# Patient Record
Sex: Female | Born: 1935
Health system: Southern US, Community
[De-identification: ages and names within clinical notes are randomized; demographics above are authoritative.]

## PROBLEM LIST (undated history)

## (undated) DIAGNOSIS — N289 Disorder of kidney and ureter, unspecified: Secondary | ICD-10-CM

## (undated) DIAGNOSIS — E119 Type 2 diabetes mellitus without complications: Secondary | ICD-10-CM

## (undated) DIAGNOSIS — D649 Anemia, unspecified: Secondary | ICD-10-CM

## (undated) DIAGNOSIS — H269 Unspecified cataract: Secondary | ICD-10-CM

## (undated) DIAGNOSIS — I10 Essential (primary) hypertension: Secondary | ICD-10-CM

## (undated) DIAGNOSIS — J45909 Unspecified asthma, uncomplicated: Secondary | ICD-10-CM

## (undated) DIAGNOSIS — E785 Hyperlipidemia, unspecified: Secondary | ICD-10-CM

## (undated) HISTORY — DX: Unspecified asthma, uncomplicated: J45.909

## (undated) HISTORY — DX: Anemia, unspecified: D64.9

## (undated) HISTORY — PX: EYE SURGERY: SHX253

## (undated) HISTORY — DX: Type 2 diabetes mellitus without complications: E11.9

## (undated) HISTORY — DX: Essential (primary) hypertension: I10

## (undated) HISTORY — DX: Unspecified cataract: H26.9

## (undated) HISTORY — DX: Hyperlipidemia, unspecified: E78.5

## (undated) HISTORY — PX: NECK SURGERY: SHX720

## (undated) HISTORY — PX: SHOULDER SURGERY: SHX246

---

## 2000-07-29 ENCOUNTER — Encounter (INDEPENDENT_AMBULATORY_CARE_PROVIDER_SITE_OTHER): Payer: Self-pay | Admitting: Specialist

## 2000-07-29 ENCOUNTER — Ambulatory Visit (HOSPITAL_BASED_OUTPATIENT_CLINIC_OR_DEPARTMENT_OTHER): Admission: RE | Admit: 2000-07-29 | Discharge: 2000-07-29 | Payer: Self-pay | Admitting: Orthopedic Surgery

## 2004-09-11 ENCOUNTER — Ambulatory Visit: Payer: Self-pay | Admitting: Internal Medicine

## 2008-08-04 ENCOUNTER — Ambulatory Visit: Payer: Self-pay | Admitting: Interventional Radiology

## 2008-08-04 ENCOUNTER — Emergency Department (HOSPITAL_BASED_OUTPATIENT_CLINIC_OR_DEPARTMENT_OTHER): Admission: EM | Admit: 2008-08-04 | Discharge: 2008-08-04 | Payer: Self-pay | Admitting: Emergency Medicine

## 2010-11-18 LAB — CBC
MCV: 83.5 fL (ref 78.0–100.0)
RBC: 3.99 MIL/uL (ref 3.87–5.11)
WBC: 5.2 10*3/uL (ref 4.0–10.5)

## 2010-11-18 LAB — DIFFERENTIAL
Eosinophils Absolute: 0.1 10*3/uL (ref 0.0–0.7)
Lymphs Abs: 2.3 10*3/uL (ref 0.7–4.0)
Monocytes Relative: 10 % (ref 3–12)
Neutrophils Relative %: 44 % (ref 43–77)

## 2010-11-18 LAB — POCT CARDIAC MARKERS
CKMB, poc: 1.7 ng/mL (ref 1.0–8.0)
Myoglobin, poc: 95.3 ng/mL (ref 12–200)
Troponin i, poc: <0.05 ng/mL (ref 0.00–0.09)

## 2010-11-18 LAB — BASIC METABOLIC PANEL
Chloride: 105 mEq/L (ref 96–112)
Creatinine, Ser: 1.4 mg/dL — ABNORMAL HIGH (ref 0.4–1.2)
GFR calc Af Amer: 45 mL/min — ABNORMAL LOW (ref >60)
Potassium: 4.8 mEq/L (ref 3.5–5.1)

## 2016-05-15 DIAGNOSIS — E1122 Type 2 diabetes mellitus with diabetic chronic kidney disease: Secondary | ICD-10-CM | POA: Diagnosis not present

## 2016-05-15 DIAGNOSIS — N184 Chronic kidney disease, stage 4 (severe): Secondary | ICD-10-CM | POA: Diagnosis not present

## 2016-05-15 DIAGNOSIS — I129 Hypertensive chronic kidney disease with stage 1 through stage 4 chronic kidney disease, or unspecified chronic kidney disease: Secondary | ICD-10-CM | POA: Diagnosis not present

## 2016-05-15 DIAGNOSIS — M5412 Radiculopathy, cervical region: Secondary | ICD-10-CM | POA: Diagnosis not present

## 2016-05-15 DIAGNOSIS — Z23 Encounter for immunization: Secondary | ICD-10-CM | POA: Diagnosis not present

## 2016-05-26 DIAGNOSIS — M47812 Spondylosis without myelopathy or radiculopathy, cervical region: Secondary | ICD-10-CM | POA: Diagnosis not present

## 2016-05-26 DIAGNOSIS — M5032 Other cervical disc degeneration, mid-cervical region, unspecified level: Secondary | ICD-10-CM | POA: Diagnosis not present

## 2016-05-26 DIAGNOSIS — M4802 Spinal stenosis, cervical region: Secondary | ICD-10-CM | POA: Diagnosis not present

## 2016-05-30 DIAGNOSIS — E119 Type 2 diabetes mellitus without complications: Secondary | ICD-10-CM | POA: Diagnosis not present

## 2016-05-30 DIAGNOSIS — H524 Presbyopia: Secondary | ICD-10-CM | POA: Diagnosis not present

## 2016-05-30 DIAGNOSIS — H43813 Vitreous degeneration, bilateral: Secondary | ICD-10-CM | POA: Diagnosis not present

## 2016-05-30 DIAGNOSIS — H2511 Age-related nuclear cataract, right eye: Secondary | ICD-10-CM | POA: Diagnosis not present

## 2016-05-30 DIAGNOSIS — H25011 Cortical age-related cataract, right eye: Secondary | ICD-10-CM | POA: Diagnosis not present

## 2016-05-30 DIAGNOSIS — Z961 Presence of intraocular lens: Secondary | ICD-10-CM | POA: Diagnosis not present

## 2016-07-09 DIAGNOSIS — M5412 Radiculopathy, cervical region: Secondary | ICD-10-CM | POA: Diagnosis not present

## 2016-07-09 DIAGNOSIS — M4802 Spinal stenosis, cervical region: Secondary | ICD-10-CM | POA: Diagnosis not present

## 2016-07-09 DIAGNOSIS — G894 Chronic pain syndrome: Secondary | ICD-10-CM | POA: Diagnosis not present

## 2016-07-09 DIAGNOSIS — I6529 Occlusion and stenosis of unspecified carotid artery: Secondary | ICD-10-CM | POA: Diagnosis not present

## 2016-07-25 DIAGNOSIS — J302 Other seasonal allergic rhinitis: Secondary | ICD-10-CM | POA: Diagnosis not present

## 2016-07-29 DIAGNOSIS — R0982 Postnasal drip: Secondary | ICD-10-CM | POA: Diagnosis not present

## 2016-07-29 DIAGNOSIS — J3489 Other specified disorders of nose and nasal sinuses: Secondary | ICD-10-CM | POA: Diagnosis not present

## 2016-07-29 DIAGNOSIS — J209 Acute bronchitis, unspecified: Secondary | ICD-10-CM | POA: Diagnosis not present

## 2016-07-31 DIAGNOSIS — Z780 Asymptomatic menopausal state: Secondary | ICD-10-CM | POA: Diagnosis not present

## 2016-07-31 DIAGNOSIS — E7800 Pure hypercholesterolemia, unspecified: Secondary | ICD-10-CM | POA: Diagnosis not present

## 2016-07-31 DIAGNOSIS — Z888 Allergy status to other drugs, medicaments and biological substances status: Secondary | ICD-10-CM | POA: Diagnosis not present

## 2016-07-31 DIAGNOSIS — B9789 Other viral agents as the cause of diseases classified elsewhere: Secondary | ICD-10-CM | POA: Diagnosis not present

## 2016-07-31 DIAGNOSIS — J988 Other specified respiratory disorders: Secondary | ICD-10-CM | POA: Diagnosis not present

## 2016-07-31 DIAGNOSIS — R0602 Shortness of breath: Secondary | ICD-10-CM | POA: Diagnosis not present

## 2016-07-31 DIAGNOSIS — J449 Chronic obstructive pulmonary disease, unspecified: Secondary | ICD-10-CM | POA: Diagnosis not present

## 2016-07-31 DIAGNOSIS — R0789 Other chest pain: Secondary | ICD-10-CM | POA: Diagnosis not present

## 2016-07-31 DIAGNOSIS — J45909 Unspecified asthma, uncomplicated: Secondary | ICD-10-CM | POA: Diagnosis not present

## 2016-07-31 DIAGNOSIS — R079 Chest pain, unspecified: Secondary | ICD-10-CM | POA: Diagnosis not present

## 2016-07-31 DIAGNOSIS — J069 Acute upper respiratory infection, unspecified: Secondary | ICD-10-CM | POA: Diagnosis not present

## 2016-07-31 DIAGNOSIS — E119 Type 2 diabetes mellitus without complications: Secondary | ICD-10-CM | POA: Diagnosis not present

## 2016-08-01 DIAGNOSIS — I361 Nonrheumatic tricuspid (valve) insufficiency: Secondary | ICD-10-CM | POA: Diagnosis not present

## 2016-08-01 DIAGNOSIS — E7800 Pure hypercholesterolemia, unspecified: Secondary | ICD-10-CM | POA: Diagnosis not present

## 2016-08-01 DIAGNOSIS — J449 Chronic obstructive pulmonary disease, unspecified: Secondary | ICD-10-CM | POA: Diagnosis not present

## 2016-08-01 DIAGNOSIS — E875 Hyperkalemia: Secondary | ICD-10-CM | POA: Diagnosis not present

## 2016-08-01 DIAGNOSIS — I272 Pulmonary hypertension, unspecified: Secondary | ICD-10-CM | POA: Diagnosis not present

## 2016-08-01 DIAGNOSIS — E873 Alkalosis: Secondary | ICD-10-CM | POA: Diagnosis not present

## 2016-08-01 DIAGNOSIS — I2699 Other pulmonary embolism without acute cor pulmonale: Secondary | ICD-10-CM | POA: Diagnosis not present

## 2016-08-01 DIAGNOSIS — R0602 Shortness of breath: Secondary | ICD-10-CM | POA: Diagnosis not present

## 2016-08-01 DIAGNOSIS — Z7951 Long term (current) use of inhaled steroids: Secondary | ICD-10-CM | POA: Diagnosis not present

## 2016-08-01 DIAGNOSIS — R0600 Dyspnea, unspecified: Secondary | ICD-10-CM | POA: Diagnosis not present

## 2016-08-01 DIAGNOSIS — Z780 Asymptomatic menopausal state: Secondary | ICD-10-CM | POA: Diagnosis not present

## 2016-08-01 DIAGNOSIS — I071 Rheumatic tricuspid insufficiency: Secondary | ICD-10-CM | POA: Diagnosis not present

## 2016-08-01 DIAGNOSIS — I2789 Other specified pulmonary heart diseases: Secondary | ICD-10-CM | POA: Diagnosis not present

## 2016-08-01 DIAGNOSIS — N289 Disorder of kidney and ureter, unspecified: Secondary | ICD-10-CM | POA: Diagnosis not present

## 2016-08-01 DIAGNOSIS — R079 Chest pain, unspecified: Secondary | ICD-10-CM | POA: Diagnosis not present

## 2016-08-01 DIAGNOSIS — E1122 Type 2 diabetes mellitus with diabetic chronic kidney disease: Secondary | ICD-10-CM | POA: Diagnosis not present

## 2016-08-01 DIAGNOSIS — R0789 Other chest pain: Secondary | ICD-10-CM | POA: Diagnosis not present

## 2016-08-01 DIAGNOSIS — J45909 Unspecified asthma, uncomplicated: Secondary | ICD-10-CM | POA: Diagnosis not present

## 2016-08-01 DIAGNOSIS — N184 Chronic kidney disease, stage 4 (severe): Secondary | ICD-10-CM | POA: Diagnosis not present

## 2016-08-01 DIAGNOSIS — E785 Hyperlipidemia, unspecified: Secondary | ICD-10-CM | POA: Diagnosis not present

## 2016-08-01 DIAGNOSIS — Z7982 Long term (current) use of aspirin: Secondary | ICD-10-CM | POA: Diagnosis not present

## 2016-08-05 DIAGNOSIS — J449 Chronic obstructive pulmonary disease, unspecified: Secondary | ICD-10-CM | POA: Diagnosis not present

## 2016-08-05 DIAGNOSIS — I2699 Other pulmonary embolism without acute cor pulmonale: Secondary | ICD-10-CM | POA: Diagnosis not present

## 2016-08-05 DIAGNOSIS — J45909 Unspecified asthma, uncomplicated: Secondary | ICD-10-CM | POA: Diagnosis not present

## 2016-08-05 DIAGNOSIS — E785 Hyperlipidemia, unspecified: Secondary | ICD-10-CM | POA: Diagnosis not present

## 2016-08-05 DIAGNOSIS — E875 Hyperkalemia: Secondary | ICD-10-CM | POA: Diagnosis not present

## 2016-08-05 DIAGNOSIS — N184 Chronic kidney disease, stage 4 (severe): Secondary | ICD-10-CM | POA: Diagnosis not present

## 2016-08-06 DIAGNOSIS — I2699 Other pulmonary embolism without acute cor pulmonale: Secondary | ICD-10-CM | POA: Diagnosis not present

## 2016-08-06 DIAGNOSIS — N184 Chronic kidney disease, stage 4 (severe): Secondary | ICD-10-CM | POA: Diagnosis not present

## 2016-08-06 DIAGNOSIS — J45909 Unspecified asthma, uncomplicated: Secondary | ICD-10-CM | POA: Diagnosis not present

## 2016-08-06 DIAGNOSIS — E875 Hyperkalemia: Secondary | ICD-10-CM | POA: Diagnosis not present

## 2016-08-06 DIAGNOSIS — J449 Chronic obstructive pulmonary disease, unspecified: Secondary | ICD-10-CM | POA: Diagnosis not present

## 2016-08-06 DIAGNOSIS — E785 Hyperlipidemia, unspecified: Secondary | ICD-10-CM | POA: Diagnosis not present

## 2016-08-07 DIAGNOSIS — E875 Hyperkalemia: Secondary | ICD-10-CM | POA: Diagnosis not present

## 2016-08-07 DIAGNOSIS — I2699 Other pulmonary embolism without acute cor pulmonale: Secondary | ICD-10-CM | POA: Diagnosis not present

## 2016-08-07 DIAGNOSIS — J45909 Unspecified asthma, uncomplicated: Secondary | ICD-10-CM | POA: Diagnosis not present

## 2016-08-07 DIAGNOSIS — J449 Chronic obstructive pulmonary disease, unspecified: Secondary | ICD-10-CM | POA: Diagnosis not present

## 2016-08-07 DIAGNOSIS — N184 Chronic kidney disease, stage 4 (severe): Secondary | ICD-10-CM | POA: Diagnosis not present

## 2016-08-07 DIAGNOSIS — E785 Hyperlipidemia, unspecified: Secondary | ICD-10-CM | POA: Diagnosis not present

## 2016-08-08 DIAGNOSIS — J45909 Unspecified asthma, uncomplicated: Secondary | ICD-10-CM | POA: Diagnosis not present

## 2016-08-08 DIAGNOSIS — I2699 Other pulmonary embolism without acute cor pulmonale: Secondary | ICD-10-CM | POA: Diagnosis not present

## 2016-08-08 DIAGNOSIS — E875 Hyperkalemia: Secondary | ICD-10-CM | POA: Diagnosis not present

## 2016-08-08 DIAGNOSIS — N184 Chronic kidney disease, stage 4 (severe): Secondary | ICD-10-CM | POA: Diagnosis not present

## 2016-08-08 DIAGNOSIS — J449 Chronic obstructive pulmonary disease, unspecified: Secondary | ICD-10-CM | POA: Diagnosis not present

## 2016-08-08 DIAGNOSIS — E785 Hyperlipidemia, unspecified: Secondary | ICD-10-CM | POA: Diagnosis not present

## 2016-08-11 DIAGNOSIS — I2782 Chronic pulmonary embolism: Secondary | ICD-10-CM | POA: Diagnosis not present

## 2016-08-14 DIAGNOSIS — I2782 Chronic pulmonary embolism: Secondary | ICD-10-CM | POA: Diagnosis not present

## 2016-08-14 DIAGNOSIS — Z5181 Encounter for therapeutic drug level monitoring: Secondary | ICD-10-CM | POA: Diagnosis not present

## 2016-08-14 DIAGNOSIS — R791 Abnormal coagulation profile: Secondary | ICD-10-CM | POA: Diagnosis not present

## 2016-08-14 DIAGNOSIS — Z7901 Long term (current) use of anticoagulants: Secondary | ICD-10-CM | POA: Diagnosis not present

## 2016-08-20 ENCOUNTER — Other Ambulatory Visit: Payer: Self-pay

## 2016-08-20 NOTE — Patient Outreach (Signed)
[name]) Care Management  08/20/2016  Susan Frey 12/02/1932 [id number]  REFERRAL DATE: 08/18/16 REFERRAL SOURCE; HTA patient discharged from hospital / [facility] REFERRAL REASON:  Transition of care.  Telephone call to patient regarding transition of care follow up. Unable to reach patient. HIPAA compliant voice message left with call back phone number.   PLAN; RNCM will attempt 2nd telephone call to patient within 1 week.   Quinn Plowman RN,BSN,CCM [facility] Telephonic  [phone]

## 2016-08-21 ENCOUNTER — Other Ambulatory Visit: Payer: Self-pay

## 2016-08-21 NOTE — Patient Outreach (Signed)
[facility]) Care Management  08/21/2016  Susan Frey Feb 19, 1933 [id number]   REFERRAL DATE: 08/18/16 REFERRAL SOURCE; HTA patient discharged from hospital / [facility] REFERRAL REASON:  Transition of care.  Telephone call to patient regarding transition of care follow up. Unable to reach patient. HIPAA compliant voice message left with call back phone number.   PLAN; RNCM will attempt 3nd telephone call to patient within 1 week.   Quinn Plowman RN,BSN,CCM [facility] Telephonic  [phone]

## 2016-08-22 ENCOUNTER — Other Ambulatory Visit: Payer: Self-pay

## 2016-08-22 NOTE — Patient Outreach (Signed)
[facility]) Care Management  08/22/2016  Susan Frey 1933-06-23 [id number]  REFERRAL Date: 08/18/16 REFERRAL SOURCE: HTA post hospital discharge on 08/08/16 from [facility] REFERRAL REASON; Transition of care program follow up. CONSENT: self. Patient gave verbal authorization to speak with her daughter, Susan Frey regarding all of her personal health information.  SUBJECTIVE; Telephone call to patient regarding transition of care follow up. HIPAA verified with patient. Patient states she has been doing fine since she has been out of the hospital. Patient reports she had a follow up appointment with her primary MD on 08/12/16.  Patient denies any changes in her treatment. Patient reports she was in the hospital for, " a bad cold and a blood clot in her lung."  Patient states she is on warfarin 5 mg at night. Patient state she is scheduled to have lab work done on Monday, 08/25/16. Patient states her next follow up appointment with her primary MD is within 1 month.  RNCM discussed signs/symptoms of bleeding with patient and advised patient to call doctor for these symptoms. RNCM advised patient importance of taking her medication as prescribed.  Patient states she is able to afford her medications.  Patient states she has support from her daughters. Patient states she has one daughter that helps her with her finances and makes sure her bills are paid. Patient states her other daughter help her with managing her medical issues. Patient states she is able to drive to her doctor appointments.  Patient verbally agreed to transition of care follow with RNCM .   ASSESSMENT; Transition of care follow up.   PLAN: RNCM will follow up with patient within 2 weeks.  RNCM will send patient [facility] care management outreach packet with consent. RNCM will send patients primary MD involvement letter.

## 2016-08-25 DIAGNOSIS — S90222A Contusion of left lesser toe(s) with damage to nail, initial encounter: Secondary | ICD-10-CM | POA: Diagnosis not present

## 2016-08-25 DIAGNOSIS — M2040 Other hammer toe(s) (acquired), unspecified foot: Secondary | ICD-10-CM | POA: Diagnosis not present

## 2016-08-25 DIAGNOSIS — E119 Type 2 diabetes mellitus without complications: Secondary | ICD-10-CM | POA: Diagnosis not present

## 2016-08-27 ENCOUNTER — Other Ambulatory Visit: Payer: Self-pay

## 2016-08-27 DIAGNOSIS — N184 Chronic kidney disease, stage 4 (severe): Secondary | ICD-10-CM | POA: Diagnosis not present

## 2016-08-27 DIAGNOSIS — J45909 Unspecified asthma, uncomplicated: Secondary | ICD-10-CM | POA: Insufficient documentation

## 2016-08-27 DIAGNOSIS — I2699 Other pulmonary embolism without acute cor pulmonale: Secondary | ICD-10-CM | POA: Insufficient documentation

## 2016-08-27 NOTE — Patient Outreach (Addendum)
[facility]) Care Management  [place]  08/27/2016   Susan Frey 20-Oct-1932 [id number]  Subjective: Telephone call to patient regarding transition of care follow up.  HIPAA verified with patient. Patient states she is doing pretty good. Patient states she had to see her foot doctor this week because she was having a little pain in her left great toe. Patient states  The doctor said her toe was ok she just needs to wear the a good pair of shoes.  Patient denies any chest pain or shortness of breath. Patient denies any signs of bleeding. Patient reports her blood sugar this morning fasting was 97. Patient states she is diabetic but not on any medication. Patient states she controls her eating. Patient states her appetite is not the best. Patient states this usually happens to her when she has gotten sick.  Patient states she is making sure she eats even if her appetite is not good.  Patient states overall she is doing ok RNCM reviewed signs of bleeding with patient. Patient verbalized awareness of not eating Benjamine Strout leafy vegetables due to taking blood thinner. Patient states she has the paper work the doctor gave her for taking a blood thinner.  RNCM advised patient to continue to take her medications as prescribed. RNCM advised patient to keep follow up appointments with doctor. RNCM informed patient she would receive [facility] care management welcome packet in the mail. Advised to sign consent form and return to [facility] care management.  RNCM discussed 24 hour nurse advise line with patient. Aware she will be provided contact number in welcome packet.   Objective: N/A   Encounter Medications:  Outpatient Encounter Prescriptions as of 08/27/2016  Medication Sig  . traMADol (ULTRAM-ER) 200 MG 24 hr tablet Take 200 mg by mouth daily. Patient states she takes as needed.  . warfarin (COUMADIN) 5 MG tablet Take 5 mg by mouth daily. Patient states she takes at night   No  facility-administered encounter medications on file as of 08/27/2016.     Functional Status:  In your present state of health, do you have any difficulty performing the following activities: 08/27/2016  Hearing? N  Vision? N  Difficulty concentrating or making decisions? N  Walking or climbing stairs? N  Dressing or bathing? N  Doing errands, shopping? N  Preparing Food and eating ? N  Using the Toilet? N  In the past six months, have you accidently leaked urine? N  Do you have problems with loss of bowel control? N  Managing your Medications? N  Managing your Finances? N  Housekeeping or managing your Housekeeping? N  Some recent data might be hidden    Fall/Depression Screening: PHQ 2/9 Scores 08/27/2016 08/27/2016  PHQ - 2 Score 0 0   Fall Risk  08/27/2016  Falls in the past year? No    THN CM Care Plan Problem One   Flowsheet Row Most Recent Value  Care Plan Problem One  Potential for readmission due to recent hospitalization   Role Documenting the Problem One  Care Management Telephonic [place] for Problem One  Active  THN Long Term Goal (31-90 days)  Patient will report no readmission to hospital within 2 weeks  THN Long Term Goal Start Date  08/22/16  [facility] Long Term Goal Met Date  -- [ongoing goal]  Interventions for Problem One Long Term Goal  RNCM advised patient to report unusual symptoms  and/or signs/ symptoms of bleeding to doctor, RNCM  advised patient to keep follow up appointments and take medications as prescribed.   THN CM Short Term Goal #1 (0-30 days)  Patient will report she is taking her medications as prescribed within 2 weeks  THN CM Short Term Goal #1 Start Date  08/22/16  THN CM Short Term Goal #1 Met Date  -- [ongoing goal]  Interventions for Short Term Goal #1  RNCM advised patient to take her medications as prescribed.   THN CM Short Term Goal #2 (0-30 days)  Patient will verbalize signs/ symptoms of bleeding due to being on coumadin  within 2 weeks.  THN CM Short Term Goal #2 Start Date  08/22/16  THN CM Short Term Goal #2 Met Date  -- [ongoing goal]  Interventions for Short Term Goal #2  RNCM reviewed signs of  bleeding with patient and advised to contact doctor     [facility] CM Care Plan Problem Two   Flowsheet Row Most Recent Value  Care Plan Problem Two  Decrease in appetite  Role Documenting the Problem Two  Care Management Telephonic Coordinator  Care Plan for Problem Two  Active  THN CM Short Term Goal #1 (0-30 days)  Patient will report increase in appetite within the next 2 weeks.  THN CM Short Term Goal #1 Start Date  08/27/16  Interventions for Short Term Goal #2   RNCM discussed with patient eating more small meals throughout the day and snacks. RNCM will send assessment to patients primary MD and notifiy of nutrition assessment score.      Assessment: Patient poor health historian.  RNCM attempt to review medications with patient. Patient has list and read list to [facility]. Difficulty with the spelling of some medications therefore unable to input all in medication list. Patient verbally appears to know which medication to take and how often. Patient states she has a daughter that checks on her. Patient unable to remember name of medication allergy.     Plan: RNCM will send patient [facility] care management welcome packet RNCM will send barrier letter and assessment to patients primary MD. [facility] will make note in assessment to primary MD of patients nutrition assessment score and decrease in appetite. RNCM will follow up with patient within 1 week.  Quinn Plowman RN,BSN,CCM [facility] Telephonic  [phone]

## 2016-08-29 DIAGNOSIS — Z7901 Long term (current) use of anticoagulants: Secondary | ICD-10-CM | POA: Diagnosis not present

## 2016-08-29 DIAGNOSIS — I2699 Other pulmonary embolism without acute cor pulmonale: Secondary | ICD-10-CM | POA: Diagnosis not present

## 2016-08-29 DIAGNOSIS — Z5181 Encounter for therapeutic drug level monitoring: Secondary | ICD-10-CM | POA: Diagnosis not present

## 2016-09-03 ENCOUNTER — Other Ambulatory Visit: Payer: Self-pay

## 2016-09-03 DIAGNOSIS — I2692 Saddle embolus of pulmonary artery without acute cor pulmonale: Secondary | ICD-10-CM | POA: Diagnosis not present

## 2016-09-03 DIAGNOSIS — N184 Chronic kidney disease, stage 4 (severe): Secondary | ICD-10-CM | POA: Diagnosis not present

## 2016-09-03 DIAGNOSIS — E875 Hyperkalemia: Secondary | ICD-10-CM | POA: Diagnosis not present

## 2016-09-03 DIAGNOSIS — E1122 Type 2 diabetes mellitus with diabetic chronic kidney disease: Secondary | ICD-10-CM | POA: Diagnosis not present

## 2016-09-03 NOTE — Patient Outreach (Addendum)
[facility]) Care Management  09/03/2016  HARBOR PASTER 1933/05/02 [id number]  SUBJECTIVE: Telephone call to patient regarding transition of care follow up.  HIPAA verified with patient. Patient denies having any symptoms related to pulmonary embolism.  Patient denies shortness of breath and chest pain. Patient states she continues to take her blood thinner. Denies any signs of bleeding.  Patient states she is having trouble with her left big toe. Patient states she has had pain in her left big toe for approximately 2-3 weeks. Patient states she saw the podiatrist approximately 2 weeks ago but nothing was done. Patient states she has a dark area on her left big toe.  Patient states her toe his painful when the bed covers lay on it. Patient states she does have a follow up appointment to see her primary MD on 2/ 5/18.   Patient states she is diabetic. States her doctor does not have her on any medication for her diabetes because he was concerned about her kidney's. Patient states she checks her blood sugars daily.  Reports today's fasting blood sugar was 116. Patient states she has a follow up appointment to see her the kidney doctor on today.  Patient reports she has not received [facility] care management welcome packet.  RNCM reinforced with patient to report problem with her foot to her primary MD at next appointment. RNCM advised patient to continue to take her medications as prescribed and report any new onset of symptoms to her doctor.  Patient verbally agreed to next telephone outreach with [facility].   ASSESSMENT:  Ongoing follow.  New problem identified with painful left foot.  Patient to report symptoms to primary MD at office visit 09/08/16.  RNCM  To follow up with patient after office visit.   RNCM called Dr. Meda Coffee Pollock's office and spoke with [place]. Informed her of symptoms to patients left great toe.  Informed that patient has had follow up appointment with Dr. Ladora Daniel,  podiatrist where she reported these symptoms.  Per patient, "nothing was done."  Informed Renee that patient continues to complain of pain to this toe and discoloration of toenail.  Patient states she is diabetic.   PLAN; RNCM will follow up with patient within 2 week.    Quinn Plowman RN,BSN,CCM [facility] Telephonic  [phone]

## 2016-09-08 DIAGNOSIS — I2782 Chronic pulmonary embolism: Secondary | ICD-10-CM | POA: Diagnosis not present

## 2016-09-08 DIAGNOSIS — M2042 Other hammer toe(s) (acquired), left foot: Secondary | ICD-10-CM | POA: Diagnosis not present

## 2016-09-08 DIAGNOSIS — E1122 Type 2 diabetes mellitus with diabetic chronic kidney disease: Secondary | ICD-10-CM | POA: Diagnosis not present

## 2016-09-08 DIAGNOSIS — M2041 Other hammer toe(s) (acquired), right foot: Secondary | ICD-10-CM | POA: Diagnosis not present

## 2016-09-08 DIAGNOSIS — I739 Peripheral vascular disease, unspecified: Secondary | ICD-10-CM | POA: Diagnosis not present

## 2016-09-08 DIAGNOSIS — N184 Chronic kidney disease, stage 4 (severe): Secondary | ICD-10-CM | POA: Diagnosis not present

## 2016-09-09 ENCOUNTER — Ambulatory Visit: Payer: Self-pay

## 2016-09-11 DIAGNOSIS — I2782 Chronic pulmonary embolism: Secondary | ICD-10-CM | POA: Diagnosis not present

## 2016-09-11 DIAGNOSIS — N184 Chronic kidney disease, stage 4 (severe): Secondary | ICD-10-CM | POA: Diagnosis not present

## 2016-09-11 DIAGNOSIS — I739 Peripheral vascular disease, unspecified: Secondary | ICD-10-CM | POA: Diagnosis not present

## 2016-09-11 DIAGNOSIS — S90229A Contusion of unspecified lesser toe(s) with damage to nail, initial encounter: Secondary | ICD-10-CM | POA: Diagnosis not present

## 2016-09-11 DIAGNOSIS — D631 Anemia in chronic kidney disease: Secondary | ICD-10-CM | POA: Diagnosis not present

## 2016-09-12 ENCOUNTER — Other Ambulatory Visit: Payer: Self-pay

## 2016-09-12 DIAGNOSIS — Z5181 Encounter for therapeutic drug level monitoring: Secondary | ICD-10-CM | POA: Diagnosis not present

## 2016-09-12 DIAGNOSIS — Z86711 Personal history of pulmonary embolism: Secondary | ICD-10-CM | POA: Diagnosis not present

## 2016-09-12 DIAGNOSIS — Z7901 Long term (current) use of anticoagulants: Secondary | ICD-10-CM | POA: Diagnosis not present

## 2016-09-12 NOTE — Patient Outreach (Signed)
[facility]) Care Management  09/12/2016  Susan Frey 1933/06/13 [id number]   Telephone call to patient for transition of care follow up.  HIPAA verified with patient. Patient states today is not a good day for her to talk with the RNCM.  Patient states she has to have labs done this morning and has an appointment with her doctor this afternoon. Patient request return call for another day.   PLAN; RNCM will attempt 2nd telephone outreach to patient within 1 week.   Quinn Plowman RN,BSN,CCM [facility] Telephonic  [phone]

## 2016-09-17 ENCOUNTER — Other Ambulatory Visit: Payer: Self-pay

## 2016-09-17 NOTE — Patient Outreach (Addendum)
[facility]) Care Management  09/17/2016  Susan Frey 06/09/1933 [id number]   SUBJECTIVE;  Telephone call to patient regarding transition of care follow up.  HIPAA verified with patient.  Patient states she followed up with her doctor on 09/09/16 regarding her toe  Patient states her doctor said she had good blood flow in her toes.  Patient states the doctor said that one of the medications she takes may cause her toenail to get darker. Patient denies having any swelling in her feet.  Patient states her pain level in her toe is a 2 today. Patient states the pain is decreasing. RNCM advised patient to wear shoes that are wider near the toe area to decrease pressure.   Patient states her appetite is continuing to get better. RNCM advised patient to continue to eat smaller more frequent meals / healthy snacks during the day.  Patient verbalized agreement.  Patient denies any new onset of symptoms and no signs of bleeding. Patient verbalized she understands to be aware of bleeding in her urine/ bowel, from her gums/ nose, and bruising on her skin.  Patient denies any of these symptoms.  Patient states she continues to take her medications as prescribed.  Patient verbally agreed to next outreach with patient.   ASSESSMENT: patient continues to self manage care.  Ongoing follow up with RNCM for disease education.  PLAN: RNCM will follow up with patient within month of March 2018.    Quinn Plowman RN,BSN,CCM [facility] Telephonic  [phone]

## 2016-09-25 DIAGNOSIS — Z5181 Encounter for therapeutic drug level monitoring: Secondary | ICD-10-CM | POA: Diagnosis not present

## 2016-09-25 DIAGNOSIS — Z86711 Personal history of pulmonary embolism: Secondary | ICD-10-CM | POA: Diagnosis not present

## 2016-09-25 DIAGNOSIS — Z7901 Long term (current) use of anticoagulants: Secondary | ICD-10-CM | POA: Diagnosis not present

## 2016-09-30 DIAGNOSIS — R112 Nausea with vomiting, unspecified: Secondary | ICD-10-CM | POA: Diagnosis not present

## 2016-09-30 DIAGNOSIS — R197 Diarrhea, unspecified: Secondary | ICD-10-CM | POA: Diagnosis not present

## 2016-09-30 DIAGNOSIS — R1012 Left upper quadrant pain: Secondary | ICD-10-CM | POA: Diagnosis not present

## 2016-10-03 DIAGNOSIS — E7800 Pure hypercholesterolemia, unspecified: Secondary | ICD-10-CM | POA: Diagnosis not present

## 2016-10-03 DIAGNOSIS — N289 Disorder of kidney and ureter, unspecified: Secondary | ICD-10-CM | POA: Diagnosis not present

## 2016-10-03 DIAGNOSIS — E860 Dehydration: Secondary | ICD-10-CM | POA: Diagnosis not present

## 2016-10-03 DIAGNOSIS — R1111 Vomiting without nausea: Secondary | ICD-10-CM | POA: Diagnosis not present

## 2016-10-03 DIAGNOSIS — E119 Type 2 diabetes mellitus without complications: Secondary | ICD-10-CM | POA: Diagnosis not present

## 2016-10-03 DIAGNOSIS — R109 Unspecified abdominal pain: Secondary | ICD-10-CM | POA: Diagnosis not present

## 2016-10-03 DIAGNOSIS — N189 Chronic kidney disease, unspecified: Secondary | ICD-10-CM | POA: Diagnosis not present

## 2016-10-03 DIAGNOSIS — K529 Noninfective gastroenteritis and colitis, unspecified: Secondary | ICD-10-CM | POA: Diagnosis not present

## 2016-10-03 DIAGNOSIS — Z780 Asymptomatic menopausal state: Secondary | ICD-10-CM | POA: Diagnosis not present

## 2016-10-09 DIAGNOSIS — R791 Abnormal coagulation profile: Secondary | ICD-10-CM | POA: Diagnosis not present

## 2016-10-09 DIAGNOSIS — Z5181 Encounter for therapeutic drug level monitoring: Secondary | ICD-10-CM | POA: Diagnosis not present

## 2016-10-10 ENCOUNTER — Other Ambulatory Visit: Payer: Self-pay

## 2016-10-10 NOTE — Patient Outreach (Signed)
[facility]) Care Management  10/10/2016  Susan Frey September 05, 1932 [id number]  Telephone call to patient for telephone assessment. Unable to reach patient. HIPAA compliant voice message left with call back phone number.   PLAN: RNCM will attempt 2nd telephone call to patient within 1 week.   Quinn Plowman RN,BSN,CCM [facility] Telephonic  [phone]

## 2016-10-14 DIAGNOSIS — H04123 Dry eye syndrome of bilateral lacrimal glands: Secondary | ICD-10-CM | POA: Diagnosis not present

## 2016-10-16 ENCOUNTER — Other Ambulatory Visit: Payer: Self-pay

## 2016-10-16 NOTE — Patient Outreach (Signed)
[place] [facility]) Care Management  10/16/2016  Susan Frey 12/04/1932 [id number]   SUBJECTIVE: Telephone call to patient for follow up.  HIPAA verified with patient. Patient states she is doing very well. Patient denies having any shortness of breath or chest pain. Patient states she continues to take her coumadin as prescribed. Patient states she goes every two weeks to have her blood draws for her coumadin. Patient states she is aware of what she cannot eat while being on coumadin. Patient states she was given a list by her doctor and from the hospital. RNCM advised patient to continue to keep her appointments with her doctors. RNCM advised patient to continue to take her medications as prescribed. RNCM advised patient to report any health concerns to her doctor.  RNCM reviewed with patient signs/ symptoms of pulmonary embolism.  Advised to contact doctor immediately. Patient verbalized understanding.  Patient denies having any pain in her foot. States pain level today is 0.   Patient states her appetite is better.  States she continues to eat small meals and snack. Patient reports today's weight is 124 lbs.  Patient states she feels she is doing very well.  Patient verbally agreed to end services with [facility] care management.  ASSESSMENT: Patient self managing care. Patient has met [facility] care management goals.  PLAN; RNCM will refer patient to care management assistant to close due to meeting planned goals. RNCM will notify patients primary MD of closure.  RNCM will send patient [facility] care management closure letter.  Quinn Plowman RN,BSN,CCM [facility] Telephonic  [phone]

## 2016-10-19 DIAGNOSIS — Z5181 Encounter for therapeutic drug level monitoring: Secondary | ICD-10-CM | POA: Diagnosis not present

## 2016-10-19 DIAGNOSIS — R791 Abnormal coagulation profile: Secondary | ICD-10-CM | POA: Diagnosis not present

## 2016-10-23 DIAGNOSIS — I2782 Chronic pulmonary embolism: Secondary | ICD-10-CM | POA: Diagnosis not present

## 2016-10-23 DIAGNOSIS — Z5181 Encounter for therapeutic drug level monitoring: Secondary | ICD-10-CM | POA: Diagnosis not present

## 2016-11-13 DIAGNOSIS — I70209 Unspecified atherosclerosis of native arteries of extremities, unspecified extremity: Secondary | ICD-10-CM | POA: Diagnosis not present

## 2016-11-13 DIAGNOSIS — I700 Atherosclerosis of aorta: Secondary | ICD-10-CM | POA: Diagnosis not present

## 2016-11-13 DIAGNOSIS — I70203 Unspecified atherosclerosis of native arteries of extremities, bilateral legs: Secondary | ICD-10-CM | POA: Diagnosis not present

## 2016-11-13 DIAGNOSIS — I6523 Occlusion and stenosis of bilateral carotid arteries: Secondary | ICD-10-CM | POA: Diagnosis not present

## 2016-11-13 DIAGNOSIS — I739 Peripheral vascular disease, unspecified: Secondary | ICD-10-CM | POA: Diagnosis not present

## 2016-11-14 DIAGNOSIS — I2782 Chronic pulmonary embolism: Secondary | ICD-10-CM | POA: Diagnosis not present

## 2016-11-14 DIAGNOSIS — Z5181 Encounter for therapeutic drug level monitoring: Secondary | ICD-10-CM | POA: Diagnosis not present

## 2016-11-17 DIAGNOSIS — E119 Type 2 diabetes mellitus without complications: Secondary | ICD-10-CM | POA: Diagnosis not present

## 2016-11-17 DIAGNOSIS — L84 Corns and callosities: Secondary | ICD-10-CM | POA: Diagnosis not present

## 2016-11-17 DIAGNOSIS — B351 Tinea unguium: Secondary | ICD-10-CM | POA: Diagnosis not present

## 2016-12-01 DIAGNOSIS — K219 Gastro-esophageal reflux disease without esophagitis: Secondary | ICD-10-CM | POA: Diagnosis not present

## 2016-12-01 DIAGNOSIS — R079 Chest pain, unspecified: Secondary | ICD-10-CM | POA: Diagnosis not present

## 2016-12-01 DIAGNOSIS — Z780 Asymptomatic menopausal state: Secondary | ICD-10-CM | POA: Diagnosis not present

## 2016-12-01 DIAGNOSIS — E7800 Pure hypercholesterolemia, unspecified: Secondary | ICD-10-CM | POA: Diagnosis not present

## 2016-12-01 DIAGNOSIS — Z8249 Family history of ischemic heart disease and other diseases of the circulatory system: Secondary | ICD-10-CM | POA: Diagnosis not present

## 2016-12-01 DIAGNOSIS — Z86711 Personal history of pulmonary embolism: Secondary | ICD-10-CM | POA: Diagnosis not present

## 2016-12-01 DIAGNOSIS — E119 Type 2 diabetes mellitus without complications: Secondary | ICD-10-CM | POA: Diagnosis not present

## 2016-12-01 DIAGNOSIS — Z7901 Long term (current) use of anticoagulants: Secondary | ICD-10-CM | POA: Diagnosis not present

## 2016-12-01 DIAGNOSIS — R0789 Other chest pain: Secondary | ICD-10-CM | POA: Diagnosis not present

## 2016-12-01 DIAGNOSIS — R0602 Shortness of breath: Secondary | ICD-10-CM | POA: Diagnosis not present

## 2016-12-08 DIAGNOSIS — R079 Chest pain, unspecified: Secondary | ICD-10-CM | POA: Diagnosis not present

## 2016-12-08 DIAGNOSIS — N184 Chronic kidney disease, stage 4 (severe): Secondary | ICD-10-CM | POA: Diagnosis not present

## 2016-12-08 DIAGNOSIS — D631 Anemia in chronic kidney disease: Secondary | ICD-10-CM | POA: Diagnosis not present

## 2016-12-08 DIAGNOSIS — Z86711 Personal history of pulmonary embolism: Secondary | ICD-10-CM | POA: Diagnosis not present

## 2016-12-08 DIAGNOSIS — E1122 Type 2 diabetes mellitus with diabetic chronic kidney disease: Secondary | ICD-10-CM | POA: Diagnosis not present

## 2016-12-12 DIAGNOSIS — I2782 Chronic pulmonary embolism: Secondary | ICD-10-CM | POA: Diagnosis not present

## 2016-12-12 DIAGNOSIS — Z5181 Encounter for therapeutic drug level monitoring: Secondary | ICD-10-CM | POA: Diagnosis not present

## 2016-12-23 DIAGNOSIS — R1030 Lower abdominal pain, unspecified: Secondary | ICD-10-CM | POA: Diagnosis not present

## 2016-12-23 DIAGNOSIS — I863 Vulval varices: Secondary | ICD-10-CM | POA: Diagnosis not present

## 2016-12-23 DIAGNOSIS — Z01419 Encounter for gynecological examination (general) (routine) without abnormal findings: Secondary | ICD-10-CM | POA: Diagnosis not present

## 2016-12-26 DIAGNOSIS — N184 Chronic kidney disease, stage 4 (severe): Secondary | ICD-10-CM | POA: Diagnosis not present

## 2016-12-30 DIAGNOSIS — R1030 Lower abdominal pain, unspecified: Secondary | ICD-10-CM | POA: Diagnosis not present

## 2017-01-01 DIAGNOSIS — R51 Headache: Secondary | ICD-10-CM | POA: Diagnosis not present

## 2017-01-05 DIAGNOSIS — D631 Anemia in chronic kidney disease: Secondary | ICD-10-CM | POA: Diagnosis not present

## 2017-01-05 DIAGNOSIS — E1122 Type 2 diabetes mellitus with diabetic chronic kidney disease: Secondary | ICD-10-CM | POA: Diagnosis not present

## 2017-01-05 DIAGNOSIS — N184 Chronic kidney disease, stage 4 (severe): Secondary | ICD-10-CM | POA: Diagnosis not present

## 2017-01-09 DIAGNOSIS — H11151 Pinguecula, right eye: Secondary | ICD-10-CM | POA: Diagnosis not present

## 2017-01-09 DIAGNOSIS — H11123 Conjunctival concretions, bilateral: Secondary | ICD-10-CM | POA: Diagnosis not present

## 2017-01-09 DIAGNOSIS — H04123 Dry eye syndrome of bilateral lacrimal glands: Secondary | ICD-10-CM | POA: Diagnosis not present

## 2017-01-16 DIAGNOSIS — Z5181 Encounter for therapeutic drug level monitoring: Secondary | ICD-10-CM | POA: Diagnosis not present

## 2017-01-16 DIAGNOSIS — R1030 Lower abdominal pain, unspecified: Secondary | ICD-10-CM | POA: Diagnosis not present

## 2017-01-16 DIAGNOSIS — I700 Atherosclerosis of aorta: Secondary | ICD-10-CM | POA: Diagnosis not present

## 2017-01-16 DIAGNOSIS — I2782 Chronic pulmonary embolism: Secondary | ICD-10-CM | POA: Diagnosis not present

## 2017-02-03 DIAGNOSIS — N184 Chronic kidney disease, stage 4 (severe): Secondary | ICD-10-CM | POA: Diagnosis not present

## 2017-02-03 DIAGNOSIS — I5042 Chronic combined systolic (congestive) and diastolic (congestive) heart failure: Secondary | ICD-10-CM | POA: Diagnosis not present

## 2017-02-03 DIAGNOSIS — I129 Hypertensive chronic kidney disease with stage 1 through stage 4 chronic kidney disease, or unspecified chronic kidney disease: Secondary | ICD-10-CM | POA: Diagnosis not present

## 2017-02-03 DIAGNOSIS — M25512 Pain in left shoulder: Secondary | ICD-10-CM | POA: Diagnosis not present

## 2017-02-20 DIAGNOSIS — J45909 Unspecified asthma, uncomplicated: Secondary | ICD-10-CM | POA: Diagnosis not present

## 2017-02-20 DIAGNOSIS — R202 Paresthesia of skin: Secondary | ICD-10-CM | POA: Diagnosis not present

## 2017-02-20 DIAGNOSIS — K219 Gastro-esophageal reflux disease without esophagitis: Secondary | ICD-10-CM | POA: Diagnosis not present

## 2017-02-20 DIAGNOSIS — G4452 New daily persistent headache (NDPH): Secondary | ICD-10-CM | POA: Diagnosis not present

## 2017-02-20 DIAGNOSIS — N184 Chronic kidney disease, stage 4 (severe): Secondary | ICD-10-CM | POA: Diagnosis not present

## 2017-02-20 DIAGNOSIS — E785 Hyperlipidemia, unspecified: Secondary | ICD-10-CM | POA: Diagnosis not present

## 2017-02-20 DIAGNOSIS — R200 Anesthesia of skin: Secondary | ICD-10-CM | POA: Diagnosis not present

## 2017-02-20 DIAGNOSIS — R51 Headache: Secondary | ICD-10-CM | POA: Diagnosis not present

## 2017-02-20 DIAGNOSIS — E875 Hyperkalemia: Secondary | ICD-10-CM | POA: Diagnosis not present

## 2017-02-20 DIAGNOSIS — R938 Abnormal findings on diagnostic imaging of other specified body structures: Secondary | ICD-10-CM | POA: Diagnosis not present

## 2017-02-20 DIAGNOSIS — E1122 Type 2 diabetes mellitus with diabetic chronic kidney disease: Secondary | ICD-10-CM | POA: Diagnosis not present

## 2017-02-20 DIAGNOSIS — I10 Essential (primary) hypertension: Secondary | ICD-10-CM | POA: Diagnosis not present

## 2017-02-21 DIAGNOSIS — N184 Chronic kidney disease, stage 4 (severe): Secondary | ICD-10-CM | POA: Diagnosis not present

## 2017-02-21 DIAGNOSIS — E1122 Type 2 diabetes mellitus with diabetic chronic kidney disease: Secondary | ICD-10-CM | POA: Diagnosis not present

## 2017-02-21 DIAGNOSIS — E785 Hyperlipidemia, unspecified: Secondary | ICD-10-CM | POA: Diagnosis not present

## 2017-02-21 DIAGNOSIS — R200 Anesthesia of skin: Secondary | ICD-10-CM | POA: Diagnosis not present

## 2017-02-21 DIAGNOSIS — E875 Hyperkalemia: Secondary | ICD-10-CM | POA: Diagnosis not present

## 2017-02-21 DIAGNOSIS — R202 Paresthesia of skin: Secondary | ICD-10-CM | POA: Diagnosis not present

## 2017-02-21 DIAGNOSIS — N179 Acute kidney failure, unspecified: Secondary | ICD-10-CM | POA: Diagnosis not present

## 2017-02-21 DIAGNOSIS — Z86711 Personal history of pulmonary embolism: Secondary | ICD-10-CM | POA: Diagnosis not present

## 2017-02-21 DIAGNOSIS — R930 Abnormal findings on diagnostic imaging of skull and head, not elsewhere classified: Secondary | ICD-10-CM | POA: Diagnosis not present

## 2017-02-21 DIAGNOSIS — J45909 Unspecified asthma, uncomplicated: Secondary | ICD-10-CM | POA: Diagnosis not present

## 2017-02-22 DIAGNOSIS — E1122 Type 2 diabetes mellitus with diabetic chronic kidney disease: Secondary | ICD-10-CM | POA: Diagnosis not present

## 2017-02-22 DIAGNOSIS — R930 Abnormal findings on diagnostic imaging of skull and head, not elsewhere classified: Secondary | ICD-10-CM | POA: Diagnosis not present

## 2017-02-22 DIAGNOSIS — N179 Acute kidney failure, unspecified: Secondary | ICD-10-CM | POA: Diagnosis not present

## 2017-02-22 DIAGNOSIS — J45909 Unspecified asthma, uncomplicated: Secondary | ICD-10-CM | POA: Diagnosis not present

## 2017-02-22 DIAGNOSIS — N184 Chronic kidney disease, stage 4 (severe): Secondary | ICD-10-CM | POA: Diagnosis not present

## 2017-02-22 DIAGNOSIS — R200 Anesthesia of skin: Secondary | ICD-10-CM | POA: Diagnosis not present

## 2017-02-22 DIAGNOSIS — E875 Hyperkalemia: Secondary | ICD-10-CM | POA: Diagnosis not present

## 2017-02-22 DIAGNOSIS — E785 Hyperlipidemia, unspecified: Secondary | ICD-10-CM | POA: Diagnosis not present

## 2017-02-22 DIAGNOSIS — K219 Gastro-esophageal reflux disease without esophagitis: Secondary | ICD-10-CM | POA: Diagnosis not present

## 2017-03-05 DIAGNOSIS — I10 Essential (primary) hypertension: Secondary | ICD-10-CM | POA: Diagnosis not present

## 2017-03-05 DIAGNOSIS — G4489 Other headache syndrome: Secondary | ICD-10-CM | POA: Diagnosis not present

## 2017-05-06 DIAGNOSIS — N184 Chronic kidney disease, stage 4 (severe): Secondary | ICD-10-CM | POA: Diagnosis not present

## 2017-05-06 DIAGNOSIS — M47812 Spondylosis without myelopathy or radiculopathy, cervical region: Secondary | ICD-10-CM | POA: Diagnosis not present

## 2017-05-06 DIAGNOSIS — S065X9A Traumatic subdural hemorrhage with loss of consciousness of unspecified duration, initial encounter: Secondary | ICD-10-CM | POA: Diagnosis not present

## 2017-05-06 DIAGNOSIS — R930 Abnormal findings on diagnostic imaging of skull and head, not elsewhere classified: Secondary | ICD-10-CM | POA: Diagnosis not present

## 2017-05-06 DIAGNOSIS — R200 Anesthesia of skin: Secondary | ICD-10-CM | POA: Diagnosis not present

## 2017-05-13 DIAGNOSIS — N2589 Other disorders resulting from impaired renal tubular function: Secondary | ICD-10-CM | POA: Diagnosis not present

## 2017-05-13 DIAGNOSIS — E1122 Type 2 diabetes mellitus with diabetic chronic kidney disease: Secondary | ICD-10-CM | POA: Diagnosis not present

## 2017-05-13 DIAGNOSIS — D631 Anemia in chronic kidney disease: Secondary | ICD-10-CM | POA: Diagnosis not present

## 2017-05-13 DIAGNOSIS — N184 Chronic kidney disease, stage 4 (severe): Secondary | ICD-10-CM | POA: Diagnosis not present

## 2017-05-15 DIAGNOSIS — I6782 Cerebral ischemia: Secondary | ICD-10-CM | POA: Diagnosis not present

## 2017-05-15 DIAGNOSIS — I70203 Unspecified atherosclerosis of native arteries of extremities, bilateral legs: Secondary | ICD-10-CM | POA: Diagnosis not present

## 2017-06-03 DIAGNOSIS — H11123 Conjunctival concretions, bilateral: Secondary | ICD-10-CM | POA: Diagnosis not present

## 2017-06-03 DIAGNOSIS — H04123 Dry eye syndrome of bilateral lacrimal glands: Secondary | ICD-10-CM | POA: Diagnosis not present

## 2017-06-03 DIAGNOSIS — H11151 Pinguecula, right eye: Secondary | ICD-10-CM | POA: Diagnosis not present

## 2017-06-03 DIAGNOSIS — E119 Type 2 diabetes mellitus without complications: Secondary | ICD-10-CM | POA: Diagnosis not present

## 2017-06-29 DIAGNOSIS — H2511 Age-related nuclear cataract, right eye: Secondary | ICD-10-CM | POA: Diagnosis not present

## 2017-06-29 DIAGNOSIS — H25011 Cortical age-related cataract, right eye: Secondary | ICD-10-CM | POA: Diagnosis not present

## 2017-06-29 DIAGNOSIS — H16222 Keratoconjunctivitis sicca, not specified as Sjogren's, left eye: Secondary | ICD-10-CM | POA: Diagnosis not present

## 2017-06-29 DIAGNOSIS — E119 Type 2 diabetes mellitus without complications: Secondary | ICD-10-CM | POA: Diagnosis not present

## 2017-07-09 DIAGNOSIS — M47812 Spondylosis without myelopathy or radiculopathy, cervical region: Secondary | ICD-10-CM | POA: Diagnosis not present

## 2017-08-06 DIAGNOSIS — M4802 Spinal stenosis, cervical region: Secondary | ICD-10-CM | POA: Diagnosis not present

## 2017-08-06 DIAGNOSIS — N184 Chronic kidney disease, stage 4 (severe): Secondary | ICD-10-CM | POA: Diagnosis not present

## 2017-08-12 DIAGNOSIS — H2511 Age-related nuclear cataract, right eye: Secondary | ICD-10-CM | POA: Diagnosis not present

## 2017-08-12 DIAGNOSIS — H25011 Cortical age-related cataract, right eye: Secondary | ICD-10-CM | POA: Diagnosis not present

## 2017-08-12 DIAGNOSIS — H25811 Combined forms of age-related cataract, right eye: Secondary | ICD-10-CM | POA: Diagnosis not present

## 2017-08-26 DIAGNOSIS — R51 Headache: Secondary | ICD-10-CM | POA: Diagnosis not present

## 2017-09-09 DIAGNOSIS — N184 Chronic kidney disease, stage 4 (severe): Secondary | ICD-10-CM | POA: Diagnosis not present

## 2017-09-16 DIAGNOSIS — N2589 Other disorders resulting from impaired renal tubular function: Secondary | ICD-10-CM | POA: Diagnosis not present

## 2017-09-16 DIAGNOSIS — E1122 Type 2 diabetes mellitus with diabetic chronic kidney disease: Secondary | ICD-10-CM | POA: Diagnosis not present

## 2017-09-16 DIAGNOSIS — I129 Hypertensive chronic kidney disease with stage 1 through stage 4 chronic kidney disease, or unspecified chronic kidney disease: Secondary | ICD-10-CM | POA: Diagnosis not present

## 2017-09-16 DIAGNOSIS — N184 Chronic kidney disease, stage 4 (severe): Secondary | ICD-10-CM | POA: Diagnosis not present

## 2017-09-23 DIAGNOSIS — S80922A Unspecified superficial injury of left lower leg, initial encounter: Secondary | ICD-10-CM | POA: Diagnosis not present

## 2017-09-23 DIAGNOSIS — I129 Hypertensive chronic kidney disease with stage 1 through stage 4 chronic kidney disease, or unspecified chronic kidney disease: Secondary | ICD-10-CM | POA: Diagnosis not present

## 2017-09-23 DIAGNOSIS — Z23 Encounter for immunization: Secondary | ICD-10-CM | POA: Diagnosis not present

## 2017-09-23 DIAGNOSIS — N184 Chronic kidney disease, stage 4 (severe): Secondary | ICD-10-CM | POA: Diagnosis not present

## 2017-11-09 DIAGNOSIS — N184 Chronic kidney disease, stage 4 (severe): Secondary | ICD-10-CM | POA: Diagnosis not present

## 2017-11-12 DIAGNOSIS — I129 Hypertensive chronic kidney disease with stage 1 through stage 4 chronic kidney disease, or unspecified chronic kidney disease: Secondary | ICD-10-CM | POA: Diagnosis not present

## 2017-11-12 DIAGNOSIS — N184 Chronic kidney disease, stage 4 (severe): Secondary | ICD-10-CM | POA: Diagnosis not present

## 2017-11-12 DIAGNOSIS — N2589 Other disorders resulting from impaired renal tubular function: Secondary | ICD-10-CM | POA: Diagnosis not present

## 2017-11-12 DIAGNOSIS — E1122 Type 2 diabetes mellitus with diabetic chronic kidney disease: Secondary | ICD-10-CM | POA: Diagnosis not present

## 2017-11-17 DIAGNOSIS — I5042 Chronic combined systolic (congestive) and diastolic (congestive) heart failure: Secondary | ICD-10-CM | POA: Diagnosis not present

## 2017-11-17 DIAGNOSIS — I130 Hypertensive heart and chronic kidney disease with heart failure and stage 1 through stage 4 chronic kidney disease, or unspecified chronic kidney disease: Secondary | ICD-10-CM | POA: Diagnosis not present

## 2017-11-17 DIAGNOSIS — I70203 Unspecified atherosclerosis of native arteries of extremities, bilateral legs: Secondary | ICD-10-CM | POA: Diagnosis not present

## 2017-11-17 DIAGNOSIS — I2782 Chronic pulmonary embolism: Secondary | ICD-10-CM | POA: Diagnosis not present

## 2017-11-18 DIAGNOSIS — M7741 Metatarsalgia, right foot: Secondary | ICD-10-CM | POA: Diagnosis not present

## 2017-11-18 DIAGNOSIS — M7742 Metatarsalgia, left foot: Secondary | ICD-10-CM | POA: Diagnosis not present

## 2017-12-17 DIAGNOSIS — I6523 Occlusion and stenosis of bilateral carotid arteries: Secondary | ICD-10-CM | POA: Diagnosis not present

## 2017-12-17 DIAGNOSIS — I129 Hypertensive chronic kidney disease with stage 1 through stage 4 chronic kidney disease, or unspecified chronic kidney disease: Secondary | ICD-10-CM | POA: Diagnosis not present

## 2017-12-17 DIAGNOSIS — Z0000 Encounter for general adult medical examination without abnormal findings: Secondary | ICD-10-CM | POA: Diagnosis not present

## 2017-12-17 DIAGNOSIS — I739 Peripheral vascular disease, unspecified: Secondary | ICD-10-CM | POA: Diagnosis not present

## 2017-12-17 DIAGNOSIS — N184 Chronic kidney disease, stage 4 (severe): Secondary | ICD-10-CM | POA: Diagnosis not present

## 2017-12-18 DIAGNOSIS — E1122 Type 2 diabetes mellitus with diabetic chronic kidney disease: Secondary | ICD-10-CM | POA: Diagnosis not present

## 2017-12-18 DIAGNOSIS — I129 Hypertensive chronic kidney disease with stage 1 through stage 4 chronic kidney disease, or unspecified chronic kidney disease: Secondary | ICD-10-CM | POA: Diagnosis not present

## 2017-12-18 DIAGNOSIS — N184 Chronic kidney disease, stage 4 (severe): Secondary | ICD-10-CM | POA: Diagnosis not present

## 2018-01-29 DIAGNOSIS — M4802 Spinal stenosis, cervical region: Secondary | ICD-10-CM | POA: Diagnosis not present

## 2018-01-29 DIAGNOSIS — M5412 Radiculopathy, cervical region: Secondary | ICD-10-CM | POA: Diagnosis not present

## 2018-01-29 DIAGNOSIS — I6529 Occlusion and stenosis of unspecified carotid artery: Secondary | ICD-10-CM | POA: Diagnosis not present

## 2018-01-29 DIAGNOSIS — M47812 Spondylosis without myelopathy or radiculopathy, cervical region: Secondary | ICD-10-CM | POA: Diagnosis not present

## 2018-02-07 DIAGNOSIS — R42 Dizziness and giddiness: Secondary | ICD-10-CM | POA: Diagnosis not present

## 2018-02-07 DIAGNOSIS — N184 Chronic kidney disease, stage 4 (severe): Secondary | ICD-10-CM | POA: Diagnosis not present

## 2018-02-07 DIAGNOSIS — R51 Headache: Secondary | ICD-10-CM | POA: Diagnosis not present

## 2018-02-07 DIAGNOSIS — E1122 Type 2 diabetes mellitus with diabetic chronic kidney disease: Secondary | ICD-10-CM | POA: Diagnosis not present

## 2018-02-15 DIAGNOSIS — I120 Hypertensive chronic kidney disease with stage 5 chronic kidney disease or end stage renal disease: Secondary | ICD-10-CM | POA: Diagnosis not present

## 2018-02-15 DIAGNOSIS — N185 Chronic kidney disease, stage 5: Secondary | ICD-10-CM | POA: Diagnosis not present

## 2018-02-15 DIAGNOSIS — E1122 Type 2 diabetes mellitus with diabetic chronic kidney disease: Secondary | ICD-10-CM | POA: Diagnosis not present

## 2018-02-25 DIAGNOSIS — R002 Palpitations: Secondary | ICD-10-CM | POA: Diagnosis not present

## 2018-02-25 DIAGNOSIS — M47812 Spondylosis without myelopathy or radiculopathy, cervical region: Secondary | ICD-10-CM | POA: Diagnosis not present

## 2018-02-25 DIAGNOSIS — M542 Cervicalgia: Secondary | ICD-10-CM | POA: Diagnosis not present

## 2018-02-25 DIAGNOSIS — I213 ST elevation (STEMI) myocardial infarction of unspecified site: Secondary | ICD-10-CM | POA: Diagnosis not present

## 2018-02-26 DIAGNOSIS — I213 ST elevation (STEMI) myocardial infarction of unspecified site: Secondary | ICD-10-CM | POA: Diagnosis not present

## 2018-03-10 DIAGNOSIS — M542 Cervicalgia: Secondary | ICD-10-CM | POA: Diagnosis not present

## 2018-03-10 DIAGNOSIS — M47812 Spondylosis without myelopathy or radiculopathy, cervical region: Secondary | ICD-10-CM | POA: Diagnosis not present

## 2018-04-07 DIAGNOSIS — R0781 Pleurodynia: Secondary | ICD-10-CM | POA: Diagnosis not present

## 2018-04-14 DIAGNOSIS — N185 Chronic kidney disease, stage 5: Secondary | ICD-10-CM | POA: Diagnosis not present

## 2018-04-14 DIAGNOSIS — D631 Anemia in chronic kidney disease: Secondary | ICD-10-CM | POA: Diagnosis not present

## 2018-04-19 DIAGNOSIS — D631 Anemia in chronic kidney disease: Secondary | ICD-10-CM | POA: Diagnosis not present

## 2018-04-19 DIAGNOSIS — I120 Hypertensive chronic kidney disease with stage 5 chronic kidney disease or end stage renal disease: Secondary | ICD-10-CM | POA: Diagnosis not present

## 2018-04-19 DIAGNOSIS — N185 Chronic kidney disease, stage 5: Secondary | ICD-10-CM | POA: Diagnosis not present

## 2018-04-19 DIAGNOSIS — E1122 Type 2 diabetes mellitus with diabetic chronic kidney disease: Secondary | ICD-10-CM | POA: Diagnosis not present

## 2018-05-05 DIAGNOSIS — N895 Stricture and atresia of vagina: Secondary | ICD-10-CM | POA: Diagnosis not present

## 2018-05-05 DIAGNOSIS — R102 Pelvic and perineal pain: Secondary | ICD-10-CM | POA: Diagnosis not present

## 2018-05-10 DIAGNOSIS — I6523 Occlusion and stenosis of bilateral carotid arteries: Secondary | ICD-10-CM | POA: Diagnosis not present

## 2018-05-10 DIAGNOSIS — N185 Chronic kidney disease, stage 5: Secondary | ICD-10-CM | POA: Diagnosis not present

## 2018-05-10 DIAGNOSIS — N186 End stage renal disease: Secondary | ICD-10-CM | POA: Diagnosis not present

## 2018-05-14 DIAGNOSIS — R9389 Abnormal findings on diagnostic imaging of other specified body structures: Secondary | ICD-10-CM | POA: Diagnosis not present

## 2018-05-14 DIAGNOSIS — R102 Pelvic and perineal pain: Secondary | ICD-10-CM | POA: Diagnosis not present

## 2018-05-14 DIAGNOSIS — N895 Stricture and atresia of vagina: Secondary | ICD-10-CM | POA: Diagnosis not present

## 2018-05-14 DIAGNOSIS — N859 Noninflammatory disorder of uterus, unspecified: Secondary | ICD-10-CM | POA: Diagnosis not present

## 2018-06-04 DIAGNOSIS — H5211 Myopia, right eye: Secondary | ICD-10-CM | POA: Diagnosis not present

## 2018-06-04 DIAGNOSIS — Z961 Presence of intraocular lens: Secondary | ICD-10-CM | POA: Diagnosis not present

## 2018-06-04 DIAGNOSIS — H524 Presbyopia: Secondary | ICD-10-CM | POA: Diagnosis not present

## 2018-06-04 DIAGNOSIS — E119 Type 2 diabetes mellitus without complications: Secondary | ICD-10-CM | POA: Diagnosis not present

## 2018-06-04 DIAGNOSIS — H2182 Plateau iris syndrome (post-iridectomy) (postprocedural): Secondary | ICD-10-CM | POA: Diagnosis not present

## 2018-06-04 DIAGNOSIS — H52203 Unspecified astigmatism, bilateral: Secondary | ICD-10-CM | POA: Diagnosis not present

## 2018-06-16 DIAGNOSIS — N185 Chronic kidney disease, stage 5: Secondary | ICD-10-CM | POA: Diagnosis not present

## 2018-06-21 DIAGNOSIS — I129 Hypertensive chronic kidney disease with stage 1 through stage 4 chronic kidney disease, or unspecified chronic kidney disease: Secondary | ICD-10-CM | POA: Diagnosis not present

## 2018-06-21 DIAGNOSIS — I6529 Occlusion and stenosis of unspecified carotid artery: Secondary | ICD-10-CM | POA: Diagnosis not present

## 2018-06-21 DIAGNOSIS — I120 Hypertensive chronic kidney disease with stage 5 chronic kidney disease or end stage renal disease: Secondary | ICD-10-CM | POA: Diagnosis not present

## 2018-06-21 DIAGNOSIS — Z23 Encounter for immunization: Secondary | ICD-10-CM | POA: Diagnosis not present

## 2018-06-21 DIAGNOSIS — N184 Chronic kidney disease, stage 4 (severe): Secondary | ICD-10-CM | POA: Diagnosis not present

## 2018-06-21 DIAGNOSIS — N185 Chronic kidney disease, stage 5: Secondary | ICD-10-CM | POA: Diagnosis not present

## 2018-06-21 DIAGNOSIS — E1122 Type 2 diabetes mellitus with diabetic chronic kidney disease: Secondary | ICD-10-CM | POA: Diagnosis not present

## 2018-06-21 DIAGNOSIS — I70203 Unspecified atherosclerosis of native arteries of extremities, bilateral legs: Secondary | ICD-10-CM | POA: Diagnosis not present

## 2018-06-21 DIAGNOSIS — D631 Anemia in chronic kidney disease: Secondary | ICD-10-CM | POA: Diagnosis not present

## 2018-07-07 DIAGNOSIS — N185 Chronic kidney disease, stage 5: Secondary | ICD-10-CM | POA: Diagnosis not present

## 2018-07-19 DIAGNOSIS — D631 Anemia in chronic kidney disease: Secondary | ICD-10-CM | POA: Diagnosis not present

## 2018-07-19 DIAGNOSIS — Z01812 Encounter for preprocedural laboratory examination: Secondary | ICD-10-CM | POA: Diagnosis not present

## 2018-07-19 DIAGNOSIS — N185 Chronic kidney disease, stage 5: Secondary | ICD-10-CM | POA: Diagnosis not present

## 2018-07-19 DIAGNOSIS — I120 Hypertensive chronic kidney disease with stage 5 chronic kidney disease or end stage renal disease: Secondary | ICD-10-CM | POA: Diagnosis not present

## 2018-07-20 DIAGNOSIS — N186 End stage renal disease: Secondary | ICD-10-CM | POA: Diagnosis not present

## 2018-07-20 DIAGNOSIS — N185 Chronic kidney disease, stage 5: Secondary | ICD-10-CM | POA: Diagnosis not present

## 2018-07-20 DIAGNOSIS — E1122 Type 2 diabetes mellitus with diabetic chronic kidney disease: Secondary | ICD-10-CM | POA: Diagnosis not present

## 2018-07-20 DIAGNOSIS — I120 Hypertensive chronic kidney disease with stage 5 chronic kidney disease or end stage renal disease: Secondary | ICD-10-CM | POA: Diagnosis not present

## 2018-08-12 DIAGNOSIS — E1122 Type 2 diabetes mellitus with diabetic chronic kidney disease: Secondary | ICD-10-CM | POA: Diagnosis not present

## 2018-08-12 DIAGNOSIS — R531 Weakness: Secondary | ICD-10-CM | POA: Diagnosis not present

## 2018-08-12 DIAGNOSIS — I120 Hypertensive chronic kidney disease with stage 5 chronic kidney disease or end stage renal disease: Secondary | ICD-10-CM | POA: Diagnosis not present

## 2018-08-12 DIAGNOSIS — N185 Chronic kidney disease, stage 5: Secondary | ICD-10-CM | POA: Diagnosis not present

## 2018-08-12 DIAGNOSIS — D649 Anemia, unspecified: Secondary | ICD-10-CM | POA: Diagnosis not present

## 2018-08-12 DIAGNOSIS — D631 Anemia in chronic kidney disease: Secondary | ICD-10-CM | POA: Diagnosis not present

## 2018-08-12 DIAGNOSIS — I440 Atrioventricular block, first degree: Secondary | ICD-10-CM | POA: Diagnosis not present

## 2018-08-16 DIAGNOSIS — N185 Chronic kidney disease, stage 5: Secondary | ICD-10-CM | POA: Diagnosis not present

## 2018-08-16 DIAGNOSIS — E1122 Type 2 diabetes mellitus with diabetic chronic kidney disease: Secondary | ICD-10-CM | POA: Diagnosis not present

## 2018-08-16 DIAGNOSIS — I10 Essential (primary) hypertension: Secondary | ICD-10-CM | POA: Diagnosis not present

## 2018-08-16 DIAGNOSIS — I120 Hypertensive chronic kidney disease with stage 5 chronic kidney disease or end stage renal disease: Secondary | ICD-10-CM | POA: Diagnosis not present

## 2018-08-24 DIAGNOSIS — N185 Chronic kidney disease, stage 5: Secondary | ICD-10-CM | POA: Diagnosis not present

## 2018-08-25 DIAGNOSIS — N185 Chronic kidney disease, stage 5: Secondary | ICD-10-CM | POA: Diagnosis not present

## 2018-08-25 DIAGNOSIS — E1122 Type 2 diabetes mellitus with diabetic chronic kidney disease: Secondary | ICD-10-CM | POA: Diagnosis not present

## 2018-08-25 DIAGNOSIS — D631 Anemia in chronic kidney disease: Secondary | ICD-10-CM | POA: Diagnosis not present

## 2018-08-25 DIAGNOSIS — I120 Hypertensive chronic kidney disease with stage 5 chronic kidney disease or end stage renal disease: Secondary | ICD-10-CM | POA: Diagnosis not present

## 2018-08-27 DIAGNOSIS — N185 Chronic kidney disease, stage 5: Secondary | ICD-10-CM | POA: Diagnosis not present

## 2018-08-27 DIAGNOSIS — I120 Hypertensive chronic kidney disease with stage 5 chronic kidney disease or end stage renal disease: Secondary | ICD-10-CM | POA: Diagnosis not present

## 2018-08-27 DIAGNOSIS — Z09 Encounter for follow-up examination after completed treatment for conditions other than malignant neoplasm: Secondary | ICD-10-CM | POA: Diagnosis not present

## 2018-09-08 DIAGNOSIS — R51 Headache: Secondary | ICD-10-CM | POA: Diagnosis not present

## 2018-09-08 DIAGNOSIS — I10 Essential (primary) hypertension: Secondary | ICD-10-CM | POA: Diagnosis not present

## 2018-10-18 DIAGNOSIS — M47812 Spondylosis without myelopathy or radiculopathy, cervical region: Secondary | ICD-10-CM | POA: Diagnosis not present

## 2018-10-18 DIAGNOSIS — I10 Essential (primary) hypertension: Secondary | ICD-10-CM | POA: Diagnosis not present

## 2018-10-22 DIAGNOSIS — N185 Chronic kidney disease, stage 5: Secondary | ICD-10-CM | POA: Diagnosis not present

## 2018-10-26 DIAGNOSIS — N185 Chronic kidney disease, stage 5: Secondary | ICD-10-CM | POA: Diagnosis not present

## 2018-10-26 DIAGNOSIS — D631 Anemia in chronic kidney disease: Secondary | ICD-10-CM | POA: Diagnosis not present

## 2018-10-26 DIAGNOSIS — I120 Hypertensive chronic kidney disease with stage 5 chronic kidney disease or end stage renal disease: Secondary | ICD-10-CM | POA: Diagnosis not present

## 2018-10-26 DIAGNOSIS — E1122 Type 2 diabetes mellitus with diabetic chronic kidney disease: Secondary | ICD-10-CM | POA: Diagnosis not present

## 2018-10-29 DIAGNOSIS — N185 Chronic kidney disease, stage 5: Secondary | ICD-10-CM | POA: Diagnosis not present

## 2018-10-29 DIAGNOSIS — I120 Hypertensive chronic kidney disease with stage 5 chronic kidney disease or end stage renal disease: Secondary | ICD-10-CM | POA: Diagnosis not present

## 2018-11-14 DIAGNOSIS — I491 Atrial premature depolarization: Secondary | ICD-10-CM | POA: Diagnosis not present

## 2018-11-14 DIAGNOSIS — R079 Chest pain, unspecified: Secondary | ICD-10-CM | POA: Diagnosis not present

## 2018-11-14 DIAGNOSIS — R0602 Shortness of breath: Secondary | ICD-10-CM | POA: Diagnosis not present

## 2018-11-14 DIAGNOSIS — N184 Chronic kidney disease, stage 4 (severe): Secondary | ICD-10-CM | POA: Diagnosis not present

## 2018-11-14 DIAGNOSIS — I444 Left anterior fascicular block: Secondary | ICD-10-CM | POA: Diagnosis not present

## 2018-11-14 DIAGNOSIS — R531 Weakness: Secondary | ICD-10-CM | POA: Diagnosis not present

## 2018-11-14 DIAGNOSIS — I129 Hypertensive chronic kidney disease with stage 1 through stage 4 chronic kidney disease, or unspecified chronic kidney disease: Secondary | ICD-10-CM | POA: Diagnosis not present

## 2018-11-15 DIAGNOSIS — I444 Left anterior fascicular block: Secondary | ICD-10-CM | POA: Diagnosis not present

## 2018-11-15 DIAGNOSIS — I491 Atrial premature depolarization: Secondary | ICD-10-CM | POA: Diagnosis not present

## 2018-11-16 DIAGNOSIS — Z79899 Other long term (current) drug therapy: Secondary | ICD-10-CM | POA: Diagnosis not present

## 2018-11-16 DIAGNOSIS — E1165 Type 2 diabetes mellitus with hyperglycemia: Secondary | ICD-10-CM | POA: Diagnosis not present

## 2018-11-16 DIAGNOSIS — G9009 Other idiopathic peripheral autonomic neuropathy: Secondary | ICD-10-CM | POA: Diagnosis not present

## 2018-11-16 DIAGNOSIS — G4733 Obstructive sleep apnea (adult) (pediatric): Secondary | ICD-10-CM | POA: Diagnosis not present

## 2018-11-16 DIAGNOSIS — M25512 Pain in left shoulder: Secondary | ICD-10-CM | POA: Diagnosis not present

## 2018-11-16 DIAGNOSIS — E785 Hyperlipidemia, unspecified: Secondary | ICD-10-CM | POA: Diagnosis not present

## 2018-11-16 DIAGNOSIS — E7800 Pure hypercholesterolemia, unspecified: Secondary | ICD-10-CM | POA: Diagnosis not present

## 2018-11-16 DIAGNOSIS — N185 Chronic kidney disease, stage 5: Secondary | ICD-10-CM | POA: Diagnosis not present

## 2018-11-16 DIAGNOSIS — Z6828 Body mass index (BMI) 28.0-28.9, adult: Secondary | ICD-10-CM | POA: Diagnosis not present

## 2018-11-16 DIAGNOSIS — M19112 Post-traumatic osteoarthritis, left shoulder: Secondary | ICD-10-CM | POA: Diagnosis not present

## 2018-11-16 DIAGNOSIS — R7301 Impaired fasting glucose: Secondary | ICD-10-CM | POA: Diagnosis not present

## 2018-11-16 DIAGNOSIS — S81802D Unspecified open wound, left lower leg, subsequent encounter: Secondary | ICD-10-CM | POA: Diagnosis not present

## 2018-11-16 DIAGNOSIS — M1712 Unilateral primary osteoarthritis, left knee: Secondary | ICD-10-CM | POA: Diagnosis not present

## 2018-11-16 DIAGNOSIS — M25562 Pain in left knee: Secondary | ICD-10-CM | POA: Diagnosis not present

## 2018-11-16 DIAGNOSIS — I10 Essential (primary) hypertension: Secondary | ICD-10-CM | POA: Diagnosis not present

## 2018-11-16 DIAGNOSIS — D698 Other specified hemorrhagic conditions: Secondary | ICD-10-CM | POA: Diagnosis not present

## 2018-11-18 DIAGNOSIS — E1122 Type 2 diabetes mellitus with diabetic chronic kidney disease: Secondary | ICD-10-CM | POA: Diagnosis not present

## 2018-11-18 DIAGNOSIS — N2581 Secondary hyperparathyroidism of renal origin: Secondary | ICD-10-CM | POA: Diagnosis not present

## 2018-11-18 DIAGNOSIS — I10 Essential (primary) hypertension: Secondary | ICD-10-CM | POA: Diagnosis not present

## 2018-11-18 DIAGNOSIS — D509 Iron deficiency anemia, unspecified: Secondary | ICD-10-CM | POA: Diagnosis not present

## 2018-11-18 DIAGNOSIS — N186 End stage renal disease: Secondary | ICD-10-CM | POA: Diagnosis not present

## 2018-12-02 DIAGNOSIS — N186 End stage renal disease: Secondary | ICD-10-CM | POA: Diagnosis not present

## 2018-12-02 DIAGNOSIS — Z992 Dependence on renal dialysis: Secondary | ICD-10-CM | POA: Diagnosis not present

## 2018-12-03 DIAGNOSIS — Z23 Encounter for immunization: Secondary | ICD-10-CM | POA: Diagnosis not present

## 2018-12-03 DIAGNOSIS — D631 Anemia in chronic kidney disease: Secondary | ICD-10-CM | POA: Diagnosis not present

## 2018-12-03 DIAGNOSIS — N2581 Secondary hyperparathyroidism of renal origin: Secondary | ICD-10-CM | POA: Diagnosis not present

## 2018-12-03 DIAGNOSIS — N186 End stage renal disease: Secondary | ICD-10-CM | POA: Diagnosis not present

## 2018-12-03 DIAGNOSIS — D509 Iron deficiency anemia, unspecified: Secondary | ICD-10-CM | POA: Diagnosis not present

## 2018-12-08 DIAGNOSIS — E1122 Type 2 diabetes mellitus with diabetic chronic kidney disease: Secondary | ICD-10-CM | POA: Diagnosis not present

## 2019-01-02 DIAGNOSIS — Z992 Dependence on renal dialysis: Secondary | ICD-10-CM | POA: Diagnosis not present

## 2019-01-02 DIAGNOSIS — N186 End stage renal disease: Secondary | ICD-10-CM | POA: Diagnosis not present

## 2019-01-03 DIAGNOSIS — D631 Anemia in chronic kidney disease: Secondary | ICD-10-CM | POA: Diagnosis not present

## 2019-01-03 DIAGNOSIS — Z23 Encounter for immunization: Secondary | ICD-10-CM | POA: Diagnosis not present

## 2019-01-03 DIAGNOSIS — N2581 Secondary hyperparathyroidism of renal origin: Secondary | ICD-10-CM | POA: Diagnosis not present

## 2019-01-03 DIAGNOSIS — D509 Iron deficiency anemia, unspecified: Secondary | ICD-10-CM | POA: Diagnosis not present

## 2019-01-03 DIAGNOSIS — N186 End stage renal disease: Secondary | ICD-10-CM | POA: Diagnosis not present

## 2019-01-05 DIAGNOSIS — E1122 Type 2 diabetes mellitus with diabetic chronic kidney disease: Secondary | ICD-10-CM | POA: Diagnosis not present

## 2019-01-18 DIAGNOSIS — R1030 Lower abdominal pain, unspecified: Secondary | ICD-10-CM | POA: Diagnosis not present

## 2019-01-27 DIAGNOSIS — R102 Pelvic and perineal pain: Secondary | ICD-10-CM | POA: Diagnosis not present

## 2019-01-27 DIAGNOSIS — N281 Cyst of kidney, acquired: Secondary | ICD-10-CM | POA: Diagnosis not present

## 2019-01-27 DIAGNOSIS — R1030 Lower abdominal pain, unspecified: Secondary | ICD-10-CM | POA: Diagnosis not present

## 2019-02-01 DIAGNOSIS — N186 End stage renal disease: Secondary | ICD-10-CM | POA: Diagnosis not present

## 2019-02-01 DIAGNOSIS — Z992 Dependence on renal dialysis: Secondary | ICD-10-CM | POA: Diagnosis not present

## 2019-02-02 DIAGNOSIS — Z23 Encounter for immunization: Secondary | ICD-10-CM | POA: Diagnosis not present

## 2019-02-02 DIAGNOSIS — D509 Iron deficiency anemia, unspecified: Secondary | ICD-10-CM | POA: Diagnosis not present

## 2019-02-02 DIAGNOSIS — I10 Essential (primary) hypertension: Secondary | ICD-10-CM | POA: Diagnosis not present

## 2019-02-02 DIAGNOSIS — N2581 Secondary hyperparathyroidism of renal origin: Secondary | ICD-10-CM | POA: Diagnosis not present

## 2019-02-02 DIAGNOSIS — D631 Anemia in chronic kidney disease: Secondary | ICD-10-CM | POA: Diagnosis not present

## 2019-02-02 DIAGNOSIS — N186 End stage renal disease: Secondary | ICD-10-CM | POA: Diagnosis not present

## 2019-02-03 DIAGNOSIS — R102 Pelvic and perineal pain: Secondary | ICD-10-CM | POA: Diagnosis not present

## 2019-02-09 DIAGNOSIS — E1122 Type 2 diabetes mellitus with diabetic chronic kidney disease: Secondary | ICD-10-CM | POA: Diagnosis not present

## 2019-02-09 DIAGNOSIS — I10 Essential (primary) hypertension: Secondary | ICD-10-CM | POA: Diagnosis not present

## 2019-02-24 DIAGNOSIS — R252 Cramp and spasm: Secondary | ICD-10-CM | POA: Diagnosis not present

## 2019-03-04 DIAGNOSIS — N186 End stage renal disease: Secondary | ICD-10-CM | POA: Diagnosis not present

## 2019-03-04 DIAGNOSIS — Z992 Dependence on renal dialysis: Secondary | ICD-10-CM | POA: Diagnosis not present

## 2019-03-05 DIAGNOSIS — N2581 Secondary hyperparathyroidism of renal origin: Secondary | ICD-10-CM | POA: Diagnosis not present

## 2019-03-05 DIAGNOSIS — N186 End stage renal disease: Secondary | ICD-10-CM | POA: Diagnosis not present

## 2019-03-05 DIAGNOSIS — D631 Anemia in chronic kidney disease: Secondary | ICD-10-CM | POA: Diagnosis not present

## 2019-03-05 DIAGNOSIS — D509 Iron deficiency anemia, unspecified: Secondary | ICD-10-CM | POA: Diagnosis not present

## 2019-03-09 DIAGNOSIS — E1122 Type 2 diabetes mellitus with diabetic chronic kidney disease: Secondary | ICD-10-CM | POA: Diagnosis not present

## 2019-04-04 DIAGNOSIS — N186 End stage renal disease: Secondary | ICD-10-CM | POA: Diagnosis not present

## 2019-04-04 DIAGNOSIS — Z992 Dependence on renal dialysis: Secondary | ICD-10-CM | POA: Diagnosis not present

## 2019-04-06 DIAGNOSIS — E1122 Type 2 diabetes mellitus with diabetic chronic kidney disease: Secondary | ICD-10-CM | POA: Diagnosis not present

## 2019-04-06 DIAGNOSIS — N2581 Secondary hyperparathyroidism of renal origin: Secondary | ICD-10-CM | POA: Diagnosis not present

## 2019-04-06 DIAGNOSIS — D631 Anemia in chronic kidney disease: Secondary | ICD-10-CM | POA: Diagnosis not present

## 2019-04-06 DIAGNOSIS — N186 End stage renal disease: Secondary | ICD-10-CM | POA: Diagnosis not present

## 2019-05-04 DIAGNOSIS — Z992 Dependence on renal dialysis: Secondary | ICD-10-CM | POA: Diagnosis not present

## 2019-05-04 DIAGNOSIS — N186 End stage renal disease: Secondary | ICD-10-CM | POA: Diagnosis not present

## 2019-05-05 DIAGNOSIS — Z23 Encounter for immunization: Secondary | ICD-10-CM | POA: Diagnosis not present

## 2019-05-05 DIAGNOSIS — R200 Anesthesia of skin: Secondary | ICD-10-CM | POA: Diagnosis not present

## 2019-05-05 DIAGNOSIS — R202 Paresthesia of skin: Secondary | ICD-10-CM | POA: Diagnosis not present

## 2019-05-06 DIAGNOSIS — I10 Essential (primary) hypertension: Secondary | ICD-10-CM | POA: Diagnosis not present

## 2019-05-06 DIAGNOSIS — N2581 Secondary hyperparathyroidism of renal origin: Secondary | ICD-10-CM | POA: Diagnosis not present

## 2019-05-06 DIAGNOSIS — N186 End stage renal disease: Secondary | ICD-10-CM | POA: Diagnosis not present

## 2019-05-06 DIAGNOSIS — D631 Anemia in chronic kidney disease: Secondary | ICD-10-CM | POA: Diagnosis not present

## 2019-05-06 DIAGNOSIS — D509 Iron deficiency anemia, unspecified: Secondary | ICD-10-CM | POA: Diagnosis not present

## 2019-05-11 DIAGNOSIS — E1122 Type 2 diabetes mellitus with diabetic chronic kidney disease: Secondary | ICD-10-CM | POA: Diagnosis not present

## 2019-05-11 DIAGNOSIS — I10 Essential (primary) hypertension: Secondary | ICD-10-CM | POA: Diagnosis not present

## 2019-06-04 DIAGNOSIS — Z992 Dependence on renal dialysis: Secondary | ICD-10-CM | POA: Diagnosis not present

## 2019-06-04 DIAGNOSIS — N186 End stage renal disease: Secondary | ICD-10-CM | POA: Diagnosis not present

## 2019-06-06 DIAGNOSIS — N186 End stage renal disease: Secondary | ICD-10-CM | POA: Diagnosis not present

## 2019-06-06 DIAGNOSIS — Z23 Encounter for immunization: Secondary | ICD-10-CM | POA: Diagnosis not present

## 2019-06-06 DIAGNOSIS — D631 Anemia in chronic kidney disease: Secondary | ICD-10-CM | POA: Diagnosis not present

## 2019-06-06 DIAGNOSIS — D509 Iron deficiency anemia, unspecified: Secondary | ICD-10-CM | POA: Diagnosis not present

## 2019-06-06 DIAGNOSIS — N2581 Secondary hyperparathyroidism of renal origin: Secondary | ICD-10-CM | POA: Diagnosis not present

## 2019-06-07 DIAGNOSIS — H52203 Unspecified astigmatism, bilateral: Secondary | ICD-10-CM | POA: Diagnosis not present

## 2019-06-07 DIAGNOSIS — H5211 Myopia, right eye: Secondary | ICD-10-CM | POA: Diagnosis not present

## 2019-06-07 DIAGNOSIS — E119 Type 2 diabetes mellitus without complications: Secondary | ICD-10-CM | POA: Diagnosis not present

## 2019-06-07 DIAGNOSIS — Z7984 Long term (current) use of oral hypoglycemic drugs: Secondary | ICD-10-CM | POA: Diagnosis not present

## 2019-06-07 DIAGNOSIS — Z961 Presence of intraocular lens: Secondary | ICD-10-CM | POA: Diagnosis not present

## 2019-06-07 DIAGNOSIS — H40033 Anatomical narrow angle, bilateral: Secondary | ICD-10-CM | POA: Diagnosis not present

## 2019-06-07 DIAGNOSIS — H524 Presbyopia: Secondary | ICD-10-CM | POA: Diagnosis not present

## 2019-06-07 DIAGNOSIS — H5202 Hypermetropia, left eye: Secondary | ICD-10-CM | POA: Diagnosis not present

## 2019-06-07 DIAGNOSIS — E113293 Type 2 diabetes mellitus with mild nonproliferative diabetic retinopathy without macular edema, bilateral: Secondary | ICD-10-CM | POA: Diagnosis not present

## 2019-06-07 DIAGNOSIS — H35043 Retinal micro-aneurysms, unspecified, bilateral: Secondary | ICD-10-CM | POA: Diagnosis not present

## 2019-06-08 DIAGNOSIS — E1122 Type 2 diabetes mellitus with diabetic chronic kidney disease: Secondary | ICD-10-CM | POA: Diagnosis not present

## 2019-07-04 DIAGNOSIS — Z992 Dependence on renal dialysis: Secondary | ICD-10-CM | POA: Diagnosis not present

## 2019-07-04 DIAGNOSIS — N186 End stage renal disease: Secondary | ICD-10-CM | POA: Diagnosis not present

## 2019-07-06 DIAGNOSIS — E1122 Type 2 diabetes mellitus with diabetic chronic kidney disease: Secondary | ICD-10-CM | POA: Diagnosis not present

## 2019-07-06 DIAGNOSIS — D631 Anemia in chronic kidney disease: Secondary | ICD-10-CM | POA: Diagnosis not present

## 2019-07-06 DIAGNOSIS — N2581 Secondary hyperparathyroidism of renal origin: Secondary | ICD-10-CM | POA: Diagnosis not present

## 2019-07-06 DIAGNOSIS — D509 Iron deficiency anemia, unspecified: Secondary | ICD-10-CM | POA: Diagnosis not present

## 2019-07-06 DIAGNOSIS — N186 End stage renal disease: Secondary | ICD-10-CM | POA: Diagnosis not present

## 2019-08-04 DIAGNOSIS — Z992 Dependence on renal dialysis: Secondary | ICD-10-CM | POA: Diagnosis not present

## 2019-08-04 DIAGNOSIS — N186 End stage renal disease: Secondary | ICD-10-CM | POA: Diagnosis not present

## 2019-08-06 DIAGNOSIS — D631 Anemia in chronic kidney disease: Secondary | ICD-10-CM | POA: Diagnosis not present

## 2019-08-06 DIAGNOSIS — N2581 Secondary hyperparathyroidism of renal origin: Secondary | ICD-10-CM | POA: Diagnosis not present

## 2019-08-06 DIAGNOSIS — N186 End stage renal disease: Secondary | ICD-10-CM | POA: Diagnosis not present

## 2019-08-06 DIAGNOSIS — D509 Iron deficiency anemia, unspecified: Secondary | ICD-10-CM | POA: Diagnosis not present

## 2019-08-06 DIAGNOSIS — I10 Essential (primary) hypertension: Secondary | ICD-10-CM | POA: Diagnosis not present

## 2019-08-10 DIAGNOSIS — I10 Essential (primary) hypertension: Secondary | ICD-10-CM | POA: Diagnosis not present

## 2019-08-10 DIAGNOSIS — E1122 Type 2 diabetes mellitus with diabetic chronic kidney disease: Secondary | ICD-10-CM | POA: Diagnosis not present

## 2019-08-18 DIAGNOSIS — M47812 Spondylosis without myelopathy or radiculopathy, cervical region: Secondary | ICD-10-CM | POA: Diagnosis not present

## 2019-08-18 DIAGNOSIS — Q761 Klippel-Feil syndrome: Secondary | ICD-10-CM | POA: Diagnosis not present

## 2019-08-18 DIAGNOSIS — I6529 Occlusion and stenosis of unspecified carotid artery: Secondary | ICD-10-CM | POA: Diagnosis not present

## 2019-08-18 DIAGNOSIS — M4802 Spinal stenosis, cervical region: Secondary | ICD-10-CM | POA: Diagnosis not present

## 2019-09-04 DIAGNOSIS — N186 End stage renal disease: Secondary | ICD-10-CM | POA: Diagnosis not present

## 2019-09-04 DIAGNOSIS — Z992 Dependence on renal dialysis: Secondary | ICD-10-CM | POA: Diagnosis not present

## 2019-09-05 DIAGNOSIS — N186 End stage renal disease: Secondary | ICD-10-CM | POA: Diagnosis not present

## 2019-09-05 DIAGNOSIS — D509 Iron deficiency anemia, unspecified: Secondary | ICD-10-CM | POA: Diagnosis not present

## 2019-09-05 DIAGNOSIS — D631 Anemia in chronic kidney disease: Secondary | ICD-10-CM | POA: Diagnosis not present

## 2019-09-05 DIAGNOSIS — N2581 Secondary hyperparathyroidism of renal origin: Secondary | ICD-10-CM | POA: Diagnosis not present

## 2019-09-07 DIAGNOSIS — E1122 Type 2 diabetes mellitus with diabetic chronic kidney disease: Secondary | ICD-10-CM | POA: Diagnosis not present

## 2019-09-07 DIAGNOSIS — E1121 Type 2 diabetes mellitus with diabetic nephropathy: Secondary | ICD-10-CM | POA: Diagnosis not present

## 2019-09-13 DIAGNOSIS — M5412 Radiculopathy, cervical region: Secondary | ICD-10-CM | POA: Diagnosis not present

## 2019-10-02 DIAGNOSIS — N186 End stage renal disease: Secondary | ICD-10-CM | POA: Diagnosis not present

## 2019-10-02 DIAGNOSIS — Z992 Dependence on renal dialysis: Secondary | ICD-10-CM | POA: Diagnosis not present

## 2019-10-03 DIAGNOSIS — N186 End stage renal disease: Secondary | ICD-10-CM | POA: Diagnosis not present

## 2019-10-03 DIAGNOSIS — N2581 Secondary hyperparathyroidism of renal origin: Secondary | ICD-10-CM | POA: Diagnosis not present

## 2019-10-03 DIAGNOSIS — D631 Anemia in chronic kidney disease: Secondary | ICD-10-CM | POA: Diagnosis not present

## 2019-10-05 DIAGNOSIS — E1122 Type 2 diabetes mellitus with diabetic chronic kidney disease: Secondary | ICD-10-CM | POA: Diagnosis not present

## 2019-11-02 DIAGNOSIS — N186 End stage renal disease: Secondary | ICD-10-CM | POA: Diagnosis not present

## 2019-11-02 DIAGNOSIS — Z992 Dependence on renal dialysis: Secondary | ICD-10-CM | POA: Diagnosis not present

## 2019-11-04 DIAGNOSIS — D509 Iron deficiency anemia, unspecified: Secondary | ICD-10-CM | POA: Diagnosis not present

## 2019-11-04 DIAGNOSIS — D631 Anemia in chronic kidney disease: Secondary | ICD-10-CM | POA: Diagnosis not present

## 2019-11-04 DIAGNOSIS — I10 Essential (primary) hypertension: Secondary | ICD-10-CM | POA: Diagnosis not present

## 2019-11-04 DIAGNOSIS — N186 End stage renal disease: Secondary | ICD-10-CM | POA: Diagnosis not present

## 2019-11-04 DIAGNOSIS — N2581 Secondary hyperparathyroidism of renal origin: Secondary | ICD-10-CM | POA: Diagnosis not present

## 2019-11-04 DIAGNOSIS — N289 Disorder of kidney and ureter, unspecified: Secondary | ICD-10-CM | POA: Diagnosis not present

## 2019-11-09 DIAGNOSIS — E1122 Type 2 diabetes mellitus with diabetic chronic kidney disease: Secondary | ICD-10-CM | POA: Diagnosis not present

## 2019-11-09 DIAGNOSIS — I10 Essential (primary) hypertension: Secondary | ICD-10-CM | POA: Diagnosis not present

## 2019-11-18 DIAGNOSIS — Z1159 Encounter for screening for other viral diseases: Secondary | ICD-10-CM | POA: Diagnosis not present

## 2019-11-22 DIAGNOSIS — Z981 Arthrodesis status: Secondary | ICD-10-CM | POA: Diagnosis not present

## 2019-11-22 DIAGNOSIS — M47812 Spondylosis without myelopathy or radiculopathy, cervical region: Secondary | ICD-10-CM | POA: Diagnosis not present

## 2019-11-22 DIAGNOSIS — M5030 Other cervical disc degeneration, unspecified cervical region: Secondary | ICD-10-CM | POA: Diagnosis not present

## 2019-11-22 DIAGNOSIS — M542 Cervicalgia: Secondary | ICD-10-CM | POA: Diagnosis not present

## 2019-12-02 DIAGNOSIS — N186 End stage renal disease: Secondary | ICD-10-CM | POA: Diagnosis not present

## 2019-12-02 DIAGNOSIS — Z992 Dependence on renal dialysis: Secondary | ICD-10-CM | POA: Diagnosis not present

## 2019-12-05 DIAGNOSIS — N186 End stage renal disease: Secondary | ICD-10-CM | POA: Diagnosis not present

## 2019-12-05 DIAGNOSIS — D631 Anemia in chronic kidney disease: Secondary | ICD-10-CM | POA: Diagnosis not present

## 2019-12-05 DIAGNOSIS — D509 Iron deficiency anemia, unspecified: Secondary | ICD-10-CM | POA: Diagnosis not present

## 2019-12-05 DIAGNOSIS — N2581 Secondary hyperparathyroidism of renal origin: Secondary | ICD-10-CM | POA: Diagnosis not present

## 2019-12-06 DIAGNOSIS — I10 Essential (primary) hypertension: Secondary | ICD-10-CM | POA: Diagnosis not present

## 2019-12-06 DIAGNOSIS — R252 Cramp and spasm: Secondary | ICD-10-CM | POA: Diagnosis not present

## 2019-12-06 DIAGNOSIS — R0781 Pleurodynia: Secondary | ICD-10-CM | POA: Diagnosis not present

## 2019-12-07 DIAGNOSIS — E1122 Type 2 diabetes mellitus with diabetic chronic kidney disease: Secondary | ICD-10-CM | POA: Diagnosis not present

## 2019-12-26 DIAGNOSIS — H65191 Other acute nonsuppurative otitis media, right ear: Secondary | ICD-10-CM | POA: Diagnosis not present

## 2019-12-28 DIAGNOSIS — H65191 Other acute nonsuppurative otitis media, right ear: Secondary | ICD-10-CM | POA: Diagnosis not present

## 2020-01-02 DIAGNOSIS — Z992 Dependence on renal dialysis: Secondary | ICD-10-CM | POA: Diagnosis not present

## 2020-01-02 DIAGNOSIS — N186 End stage renal disease: Secondary | ICD-10-CM | POA: Diagnosis not present

## 2020-01-04 DIAGNOSIS — N2581 Secondary hyperparathyroidism of renal origin: Secondary | ICD-10-CM | POA: Diagnosis not present

## 2020-01-04 DIAGNOSIS — N186 End stage renal disease: Secondary | ICD-10-CM | POA: Diagnosis not present

## 2020-01-04 DIAGNOSIS — E1122 Type 2 diabetes mellitus with diabetic chronic kidney disease: Secondary | ICD-10-CM | POA: Diagnosis not present

## 2020-01-04 DIAGNOSIS — D631 Anemia in chronic kidney disease: Secondary | ICD-10-CM | POA: Diagnosis not present

## 2020-01-31 DIAGNOSIS — H938X1 Other specified disorders of right ear: Secondary | ICD-10-CM | POA: Diagnosis not present

## 2020-02-01 DIAGNOSIS — Z992 Dependence on renal dialysis: Secondary | ICD-10-CM | POA: Diagnosis not present

## 2020-02-01 DIAGNOSIS — N186 End stage renal disease: Secondary | ICD-10-CM | POA: Diagnosis not present

## 2020-02-03 DIAGNOSIS — D631 Anemia in chronic kidney disease: Secondary | ICD-10-CM | POA: Diagnosis not present

## 2020-02-03 DIAGNOSIS — I10 Essential (primary) hypertension: Secondary | ICD-10-CM | POA: Diagnosis not present

## 2020-02-03 DIAGNOSIS — N186 End stage renal disease: Secondary | ICD-10-CM | POA: Diagnosis not present

## 2020-02-03 DIAGNOSIS — N2581 Secondary hyperparathyroidism of renal origin: Secondary | ICD-10-CM | POA: Diagnosis not present

## 2020-02-08 DIAGNOSIS — E1122 Type 2 diabetes mellitus with diabetic chronic kidney disease: Secondary | ICD-10-CM | POA: Diagnosis not present

## 2020-02-22 DIAGNOSIS — I10 Essential (primary) hypertension: Secondary | ICD-10-CM | POA: Diagnosis not present

## 2020-03-03 DIAGNOSIS — Z992 Dependence on renal dialysis: Secondary | ICD-10-CM | POA: Diagnosis not present

## 2020-03-03 DIAGNOSIS — N186 End stage renal disease: Secondary | ICD-10-CM | POA: Diagnosis not present

## 2020-03-05 ENCOUNTER — Other Ambulatory Visit: Payer: Self-pay

## 2020-03-05 DIAGNOSIS — N2581 Secondary hyperparathyroidism of renal origin: Secondary | ICD-10-CM | POA: Diagnosis not present

## 2020-03-05 DIAGNOSIS — N186 End stage renal disease: Secondary | ICD-10-CM | POA: Diagnosis not present

## 2020-03-05 DIAGNOSIS — D631 Anemia in chronic kidney disease: Secondary | ICD-10-CM | POA: Diagnosis not present

## 2020-03-05 DIAGNOSIS — D509 Iron deficiency anemia, unspecified: Secondary | ICD-10-CM | POA: Diagnosis not present

## 2020-03-05 NOTE — Patient Outreach (Signed)
Aging Gracefully Program  03/05/2020  Susan Frey [birth year] [id number]  [facility] Evaluation Interviewer attempted to call patient on today regarding Aging Gracefully referral. No answer from patient after multiple rings. Left voicemail for returned call.  Will attempt to call back within 1 week.  [place] Management Assistant [phone]

## 2020-03-05 NOTE — Patient Outreach (Signed)
Aging Gracefully Program  03/05/2020  Susan Frey 24-Jul-1933 [id number]  [facility] Evaluation Interviewer made contact with patient. Aging Gracefully survey scheduled for 03/05/2020 at 1:00 p.m.  [place] Management Assistant [phone]

## 2020-03-06 ENCOUNTER — Other Ambulatory Visit: Payer: Self-pay

## 2020-03-06 NOTE — Patient Outreach (Signed)
Aging Gracefully Program  03/06/2020  Susan Frey May 08, 1933 [id number]  [facility] Evaluation Interviewer made contact with patient. Aging Gracefully survey completed.   Interviewer will send referral to Tomasa Rand, RN and OT for follow up.   [place] Management Assistant [phone]

## 2020-03-07 DIAGNOSIS — E1122 Type 2 diabetes mellitus with diabetic chronic kidney disease: Secondary | ICD-10-CM | POA: Diagnosis not present

## 2020-03-15 DIAGNOSIS — N186 End stage renal disease: Secondary | ICD-10-CM | POA: Diagnosis not present

## 2020-03-15 DIAGNOSIS — I120 Hypertensive chronic kidney disease with stage 5 chronic kidney disease or end stage renal disease: Secondary | ICD-10-CM | POA: Diagnosis not present

## 2020-03-15 DIAGNOSIS — K219 Gastro-esophageal reflux disease without esophagitis: Secondary | ICD-10-CM | POA: Diagnosis not present

## 2020-03-15 DIAGNOSIS — Z992 Dependence on renal dialysis: Secondary | ICD-10-CM | POA: Diagnosis not present

## 2020-03-15 DIAGNOSIS — E785 Hyperlipidemia, unspecified: Secondary | ICD-10-CM | POA: Diagnosis not present

## 2020-03-15 DIAGNOSIS — I770 Arteriovenous fistula, acquired: Secondary | ICD-10-CM | POA: Diagnosis not present

## 2020-03-15 DIAGNOSIS — N2581 Secondary hyperparathyroidism of renal origin: Secondary | ICD-10-CM | POA: Diagnosis not present

## 2020-03-15 DIAGNOSIS — M542 Cervicalgia: Secondary | ICD-10-CM | POA: Diagnosis not present

## 2020-03-15 DIAGNOSIS — I739 Peripheral vascular disease, unspecified: Secondary | ICD-10-CM | POA: Diagnosis not present

## 2020-03-15 DIAGNOSIS — I6529 Occlusion and stenosis of unspecified carotid artery: Secondary | ICD-10-CM | POA: Diagnosis not present

## 2020-03-15 DIAGNOSIS — E1122 Type 2 diabetes mellitus with diabetic chronic kidney disease: Secondary | ICD-10-CM | POA: Diagnosis not present

## 2020-03-15 DIAGNOSIS — I70203 Unspecified atherosclerosis of native arteries of extremities, bilateral legs: Secondary | ICD-10-CM | POA: Diagnosis not present

## 2020-03-26 DIAGNOSIS — I444 Left anterior fascicular block: Secondary | ICD-10-CM | POA: Diagnosis not present

## 2020-03-26 DIAGNOSIS — I440 Atrioventricular block, first degree: Secondary | ICD-10-CM | POA: Diagnosis not present

## 2020-03-26 DIAGNOSIS — K7689 Other specified diseases of liver: Secondary | ICD-10-CM | POA: Diagnosis not present

## 2020-03-26 DIAGNOSIS — R0789 Other chest pain: Secondary | ICD-10-CM | POA: Diagnosis not present

## 2020-03-26 DIAGNOSIS — R079 Chest pain, unspecified: Secondary | ICD-10-CM | POA: Diagnosis not present

## 2020-03-26 DIAGNOSIS — R9431 Abnormal electrocardiogram [ECG] [EKG]: Secondary | ICD-10-CM | POA: Diagnosis not present

## 2020-03-26 DIAGNOSIS — I447 Left bundle-branch block, unspecified: Secondary | ICD-10-CM | POA: Diagnosis not present

## 2020-03-26 DIAGNOSIS — I491 Atrial premature depolarization: Secondary | ICD-10-CM | POA: Diagnosis not present

## 2020-03-27 DIAGNOSIS — T82590A Other mechanical complication of surgically created arteriovenous fistula, initial encounter: Secondary | ICD-10-CM | POA: Diagnosis not present

## 2020-03-27 DIAGNOSIS — N186 End stage renal disease: Secondary | ICD-10-CM | POA: Diagnosis not present

## 2020-03-27 DIAGNOSIS — I444 Left anterior fascicular block: Secondary | ICD-10-CM | POA: Diagnosis not present

## 2020-03-27 DIAGNOSIS — I491 Atrial premature depolarization: Secondary | ICD-10-CM | POA: Diagnosis not present

## 2020-03-27 DIAGNOSIS — I440 Atrioventricular block, first degree: Secondary | ICD-10-CM | POA: Diagnosis not present

## 2020-03-28 ENCOUNTER — Other Ambulatory Visit: Payer: Self-pay | Admitting: Specialist

## 2020-03-28 DIAGNOSIS — I517 Cardiomegaly: Secondary | ICD-10-CM | POA: Diagnosis not present

## 2020-03-28 DIAGNOSIS — Z950 Presence of cardiac pacemaker: Secondary | ICD-10-CM | POA: Diagnosis not present

## 2020-03-28 DIAGNOSIS — I440 Atrioventricular block, first degree: Secondary | ICD-10-CM | POA: Diagnosis not present

## 2020-03-28 DIAGNOSIS — M940 Chondrocostal junction syndrome [Tietze]: Secondary | ICD-10-CM | POA: Diagnosis not present

## 2020-03-28 DIAGNOSIS — I491 Atrial premature depolarization: Secondary | ICD-10-CM | POA: Diagnosis not present

## 2020-03-28 DIAGNOSIS — I445 Left posterior fascicular block: Secondary | ICD-10-CM | POA: Diagnosis not present

## 2020-03-29 ENCOUNTER — Other Ambulatory Visit: Payer: Self-pay

## 2020-03-29 DIAGNOSIS — I120 Hypertensive chronic kidney disease with stage 5 chronic kidney disease or end stage renal disease: Secondary | ICD-10-CM | POA: Diagnosis not present

## 2020-03-29 DIAGNOSIS — K219 Gastro-esophageal reflux disease without esophagitis: Secondary | ICD-10-CM | POA: Diagnosis not present

## 2020-03-29 DIAGNOSIS — I088 Other rheumatic multiple valve diseases: Secondary | ICD-10-CM | POA: Diagnosis not present

## 2020-03-29 DIAGNOSIS — R1011 Right upper quadrant pain: Secondary | ICD-10-CM | POA: Diagnosis not present

## 2020-03-29 DIAGNOSIS — I444 Left anterior fascicular block: Secondary | ICD-10-CM | POA: Diagnosis not present

## 2020-03-29 DIAGNOSIS — I517 Cardiomegaly: Secondary | ICD-10-CM | POA: Diagnosis not present

## 2020-03-29 DIAGNOSIS — E1122 Type 2 diabetes mellitus with diabetic chronic kidney disease: Secondary | ICD-10-CM | POA: Diagnosis not present

## 2020-03-29 DIAGNOSIS — E785 Hyperlipidemia, unspecified: Secondary | ICD-10-CM | POA: Diagnosis not present

## 2020-03-29 DIAGNOSIS — I440 Atrioventricular block, first degree: Secondary | ICD-10-CM | POA: Diagnosis not present

## 2020-03-29 DIAGNOSIS — R011 Cardiac murmur, unspecified: Secondary | ICD-10-CM | POA: Diagnosis not present

## 2020-03-29 DIAGNOSIS — N186 End stage renal disease: Secondary | ICD-10-CM | POA: Diagnosis not present

## 2020-03-29 DIAGNOSIS — S39011A Strain of muscle, fascia and tendon of abdomen, initial encounter: Secondary | ICD-10-CM | POA: Diagnosis not present

## 2020-03-29 DIAGNOSIS — K7689 Other specified diseases of liver: Secondary | ICD-10-CM | POA: Diagnosis not present

## 2020-03-29 DIAGNOSIS — Z992 Dependence on renal dialysis: Secondary | ICD-10-CM | POA: Diagnosis not present

## 2020-03-29 DIAGNOSIS — N2889 Other specified disorders of kidney and ureter: Secondary | ICD-10-CM | POA: Diagnosis not present

## 2020-03-29 DIAGNOSIS — Z7982 Long term (current) use of aspirin: Secondary | ICD-10-CM | POA: Diagnosis not present

## 2020-03-29 DIAGNOSIS — F419 Anxiety disorder, unspecified: Secondary | ICD-10-CM | POA: Diagnosis not present

## 2020-03-29 DIAGNOSIS — I313 Pericardial effusion (noninflammatory): Secondary | ICD-10-CM | POA: Diagnosis not present

## 2020-03-29 DIAGNOSIS — R0781 Pleurodynia: Secondary | ICD-10-CM | POA: Diagnosis not present

## 2020-03-29 DIAGNOSIS — R0789 Other chest pain: Secondary | ICD-10-CM | POA: Diagnosis not present

## 2020-03-29 DIAGNOSIS — R079 Chest pain, unspecified: Secondary | ICD-10-CM | POA: Diagnosis not present

## 2020-03-30 DIAGNOSIS — I120 Hypertensive chronic kidney disease with stage 5 chronic kidney disease or end stage renal disease: Secondary | ICD-10-CM | POA: Diagnosis not present

## 2020-03-30 DIAGNOSIS — Z992 Dependence on renal dialysis: Secondary | ICD-10-CM | POA: Diagnosis not present

## 2020-03-30 DIAGNOSIS — Z79899 Other long term (current) drug therapy: Secondary | ICD-10-CM | POA: Diagnosis not present

## 2020-03-30 DIAGNOSIS — R079 Chest pain, unspecified: Secondary | ICD-10-CM | POA: Diagnosis not present

## 2020-03-30 DIAGNOSIS — N186 End stage renal disease: Secondary | ICD-10-CM | POA: Diagnosis not present

## 2020-03-30 DIAGNOSIS — E785 Hyperlipidemia, unspecified: Secondary | ICD-10-CM | POA: Diagnosis not present

## 2020-03-30 DIAGNOSIS — K219 Gastro-esophageal reflux disease without esophagitis: Secondary | ICD-10-CM | POA: Diagnosis not present

## 2020-03-30 DIAGNOSIS — E1122 Type 2 diabetes mellitus with diabetic chronic kidney disease: Secondary | ICD-10-CM | POA: Diagnosis not present

## 2020-03-31 DIAGNOSIS — I440 Atrioventricular block, first degree: Secondary | ICD-10-CM | POA: Diagnosis not present

## 2020-03-31 DIAGNOSIS — I444 Left anterior fascicular block: Secondary | ICD-10-CM | POA: Diagnosis not present

## 2020-03-31 DIAGNOSIS — I517 Cardiomegaly: Secondary | ICD-10-CM | POA: Diagnosis not present

## 2020-04-03 DIAGNOSIS — N186 End stage renal disease: Secondary | ICD-10-CM | POA: Diagnosis not present

## 2020-04-03 DIAGNOSIS — Z992 Dependence on renal dialysis: Secondary | ICD-10-CM | POA: Diagnosis not present

## 2020-04-04 DIAGNOSIS — M7918 Myalgia, other site: Secondary | ICD-10-CM | POA: Diagnosis not present

## 2020-04-06 DIAGNOSIS — N186 End stage renal disease: Secondary | ICD-10-CM | POA: Diagnosis not present

## 2020-04-06 DIAGNOSIS — E1122 Type 2 diabetes mellitus with diabetic chronic kidney disease: Secondary | ICD-10-CM | POA: Diagnosis not present

## 2020-04-06 DIAGNOSIS — N2581 Secondary hyperparathyroidism of renal origin: Secondary | ICD-10-CM | POA: Diagnosis not present

## 2020-04-06 DIAGNOSIS — D631 Anemia in chronic kidney disease: Secondary | ICD-10-CM | POA: Diagnosis not present

## 2020-04-13 ENCOUNTER — Other Ambulatory Visit: Payer: Self-pay

## 2020-04-13 NOTE — Patient Outreach (Signed)
Aging Gracefully Program  04/13/2020  Susan Frey 1933/02/21 [id number]   Care Coordination: Placed call to patient to schedule home visit. No answer. Left a message requesting a call back.  PLAN: will await a call back.  Tomasa Rand, RN, BSN, CEN [facility] [employer] [phone]

## 2020-04-16 ENCOUNTER — Encounter (HOSPITAL_BASED_OUTPATIENT_CLINIC_OR_DEPARTMENT_OTHER): Payer: Self-pay

## 2020-04-16 ENCOUNTER — Other Ambulatory Visit: Payer: Self-pay

## 2020-04-16 ENCOUNTER — Emergency Department (HOSPITAL_BASED_OUTPATIENT_CLINIC_OR_DEPARTMENT_OTHER): Payer: PPO

## 2020-04-16 DIAGNOSIS — J45909 Unspecified asthma, uncomplicated: Secondary | ICD-10-CM | POA: Insufficient documentation

## 2020-04-16 DIAGNOSIS — R0789 Other chest pain: Secondary | ICD-10-CM | POA: Insufficient documentation

## 2020-04-16 DIAGNOSIS — Z7982 Long term (current) use of aspirin: Secondary | ICD-10-CM | POA: Insufficient documentation

## 2020-04-16 DIAGNOSIS — Z7901 Long term (current) use of anticoagulants: Secondary | ICD-10-CM | POA: Insufficient documentation

## 2020-04-16 DIAGNOSIS — R079 Chest pain, unspecified: Secondary | ICD-10-CM | POA: Diagnosis not present

## 2020-04-16 DIAGNOSIS — E119 Type 2 diabetes mellitus without complications: Secondary | ICD-10-CM | POA: Diagnosis not present

## 2020-04-16 DIAGNOSIS — Z7951 Long term (current) use of inhaled steroids: Secondary | ICD-10-CM | POA: Diagnosis not present

## 2020-04-16 LAB — CBC
HCT: 35.6 % — ABNORMAL LOW (ref 36.0–46.0)
Hemoglobin: 10.6 g/dL — ABNORMAL LOW (ref 12.0–15.0)
MCH: 25.7 pg — ABNORMAL LOW (ref 26.0–34.0)
MCHC: 29.8 g/dL — ABNORMAL LOW (ref 30.0–36.0)
MCV: 86.2 fL (ref 80.0–100.0)
Platelets: 128 10*3/uL — ABNORMAL LOW (ref 150–400)
RBC: 4.13 MIL/uL (ref 3.87–5.11)
RDW: 16.9 % — ABNORMAL HIGH (ref 11.5–15.5)
WBC: 5.2 10*3/uL (ref 4.0–10.5)
nRBC: 0 % (ref 0.0–0.2)

## 2020-04-16 LAB — BASIC METABOLIC PANEL
Anion gap: 11 (ref 5–15)
BUN: 20 mg/dL (ref 8–23)
CO2: 30 mmol/L (ref 22–32)
Calcium: 8.6 mg/dL — ABNORMAL LOW (ref 8.9–10.3)
Chloride: 100 mmol/L (ref 98–111)
Creatinine, Ser: 3.41 mg/dL — ABNORMAL HIGH (ref 0.44–1.00)
GFR calc Af Amer: 14 mL/min — ABNORMAL LOW (ref >60)
GFR calc non Af Amer: 12 mL/min — ABNORMAL LOW (ref >60)
Glucose, Bld: 112 mg/dL — ABNORMAL HIGH (ref 70–99)
Potassium: 4.5 mmol/L (ref 3.5–5.1)
Sodium: 141 mmol/L (ref 135–145)

## 2020-04-16 LAB — TROPONIN I (HIGH SENSITIVITY): Troponin I (High Sensitivity): 7 ng/L (ref <18)

## 2020-04-16 NOTE — ED Triage Notes (Signed)
Pt c/o right side and left side side chest pain-denies fever/flu like sx-NAD-steady gait

## 2020-04-17 ENCOUNTER — Emergency Department (HOSPITAL_BASED_OUTPATIENT_CLINIC_OR_DEPARTMENT_OTHER)
Admission: EM | Admit: 2020-04-17 | Discharge: 2020-04-17 | Disposition: A | Discharge status: Home / Self Care | Payer: PPO | Attending: Emergency Medicine | Admitting: Emergency Medicine

## 2020-04-17 DIAGNOSIS — R0789 Other chest pain: Secondary | ICD-10-CM

## 2020-04-17 LAB — D-DIMER, QUANTITATIVE: D-Dimer, Quant: 0.74 ug/mL-FEU — ABNORMAL HIGH (ref 0.00–0.50)

## 2020-04-17 LAB — TROPONIN I (HIGH SENSITIVITY): Troponin I (High Sensitivity): 7 ng/L (ref <18)

## 2020-04-17 MED ORDER — LIDOCAINE VISCOUS HCL 2 % MT SOLN
15.0000 mL | Freq: Once | OROMUCOSAL | Status: AC
  Administered 2020-04-17: 15 mL via ORAL
  Filled 2020-04-17: qty 15

## 2020-04-17 MED ORDER — ALUM & MAG HYDROXIDE-SIMETH 200-200-20 MG/5ML PO SUSP
30.0000 mL | Freq: Once | ORAL | Status: AC
  Administered 2020-04-17: 30 mL via ORAL
  Filled 2020-04-17: qty 30

## 2020-04-17 NOTE — ED Provider Notes (Signed)
[facility] EMERGENCY DEPARTMENT Provider Note  CSN: [id number] Arrival date & time: 04/16/20 2104  Chief Complaint(s) Chest Pain  HPI Susan Frey is a 86 y.o. female with past medical history listed below including ESRD on dialysis who presents with several weeks of constant but fluctuating left lower chest achiness.  Worse with palpation and eating.  Patient reports that she had similar pain several weeks ago on the right which completely subsided but moved over to the left.  She was evaluated and told it was a muscle cramp.  She denies any associated shortness of breath.  Some nausea without emesis.  She does report having increased heartburn.  No abdominal pain.  No diarrhea or constipation.  No urinary symptoms.  No other physical complaints.  HPI  Past Medical History Past Medical History:  Diagnosis Date  . Anemia   . Asthma   . Cataract   . Diabetes mellitus without complication [facility])    Patient Active Problem List   Diagnosis Date Noted  . Asthma 08/27/2016  . Acute pulmonary embolism ([place]) 08/27/2016   Home Medication(s) Prior to Admission medications   Medication Sig Start Date End Date Taking? Authorizing Provider  albuterol (VENTOLIN HFA) 108 (90 Base) MCG/ACT inhaler Inhale into the lungs. 08/02/16  Yes [provider]  aspirin 81 MG EC tablet Take by mouth. 02/22/17  Yes [provider]  diclofenac Sodium (VOLTAREN) 1 % GEL APPLY EXTERNALLY TO THE AFFECTED AREA THREE TIMES DAILY AS NEEDED 02/03/17  Yes [provider]  omeprazole (PRILOSEC) 40 MG capsule Take by mouth. 03/15/20  Yes [provider]  amLODipine (NORVASC) 5 MG tablet Take 5 mg by mouth daily. 02/02/20   [provider]  calcium carbonate (TUMS EX) 750 MG chewable tablet Chew by mouth.    [provider]  rosuvastatin (CRESTOR) 10 MG tablet Take 10 mg by mouth at bedtime. 03/31/20   [provider]  traMADol (ULTRAM) 50 MG tablet Take 50  mg by mouth 3 (three) times daily as needed. 04/16/20   [provider]  traMADol (ULTRAM-ER) 200 MG 24 hr tablet Take 200 mg by mouth daily. Patient states she takes as needed.    [provider]  warfarin (COUMADIN) 5 MG tablet Take 5 mg by mouth daily. Patient states she takes at night    [provider]                                                                                                                                    Past Surgical History Past Surgical History:  Procedure Laterality Date  . NECK SURGERY    . SHOULDER SURGERY     patient states both right and left shoulders   Family History Family History  Problem Relation Age of Onset  . Heart disease Mother   . Hypertension Sister   . Hypertension Brother   . Hypertension  Daughter   . Cancer Maternal Grandmother     Social History Social History   Tobacco Use  . Smoking status: Never Smoker  . Smokeless tobacco: Never Used  Vaping Use  . Vaping Use: Never used  Substance Use Topics  . Alcohol use: No  . Drug use: No   Allergies Patient has no allergy information on record.  Review of Systems Review of Systems All other systems are reviewed and are negative for acute change except as noted in the HPI  Physical Exam Vital Signs  I have reviewed the triage vital signs BP (!) 160/77   Pulse 80   Temp 98.4 F (36.9 C) (Oral)   Resp 19   Ht 5\' 3"  (1.6 m)   Wt 51.3 kg   SpO2 99%   BMI 20.02 kg/m   Physical Exam Vitals reviewed.  Constitutional:      General: She is not in acute distress.    Appearance: She is well-developed. She is not diaphoretic.  HENT:     Head: Normocephalic and atraumatic.     Nose: Nose normal.  Eyes:     General: No scleral icterus.       Right eye: No discharge.        Left eye: No discharge.     Conjunctiva/sclera: Conjunctivae normal.     Pupils: Pupils are equal, round, and reactive to light.  Cardiovascular:     Rate and Rhythm:  Normal rate and regular rhythm.     Heart sounds: No murmur heard.  No friction rub. No gallop.   Pulmonary:     Effort: Pulmonary effort is normal. No respiratory distress.     Breath sounds: Normal breath sounds. No stridor. No rales.  Chest:     Chest wall: Tenderness present.    Abdominal:     General: There is no distension.     Palpations: Abdomen is soft.     Tenderness: There is no abdominal tenderness.  Musculoskeletal:        General: No tenderness.     Cervical back: Normal range of motion and neck supple.  Skin:    General: Skin is warm and dry.     Findings: No erythema or rash.  Neurological:     Mental Status: She is alert and oriented to person, place, and time.     ED Results and Treatments Labs (all labs ordered are listed, but only abnormal results are displayed) Labs Reviewed  BASIC METABOLIC PANEL - Abnormal; Notable for the following components:      Result Value   Glucose, Bld 112 (*)    Creatinine, Ser 3.41 (*)    Calcium 8.6 (*)    GFR calc non Af Amer 12 (*)    GFR calc Af Amer 14 (*)    All other components within normal limits  CBC - Abnormal; Notable for the following components:   Hemoglobin 10.6 (*)    HCT 35.6 (*)    MCH 25.7 (*)    MCHC 29.8 (*)    RDW 16.9 (*)    Platelets 128 (*)    All other components within normal limits  D-DIMER, QUANTITATIVE (NOT AT [facility]) - Abnormal; Notable for the following components:   D-Dimer, Quant 0.74 (*)    All other components within normal limits  TROPONIN I (HIGH SENSITIVITY)  TROPONIN I (HIGH SENSITIVITY)  EKG  EKG Interpretation  Date/Time:  Monday April 16 2020 21:14:24 EDT Ventricular Rate:  86 PR Interval:  160 QRS Duration: 94 QT Interval:  380 QTC Calculation: 250 R Axis:   -63 Text Interpretation: Normal sinus rhythm Left anterior fascicular block Minimal  voltage criteria for LVH, may be normal variant ( Cornell product ) Septal infarct , age undetermined Abnormal ECG No significant change since last tracing Confirmed by Addison Lank [phone]) on 04/17/2020 1:46:57 AM      Radiology DG Chest 2 View  Result Date: 04/16/2020 CLINICAL DATA:  Chest pain EXAM: CHEST - 2 VIEW COMPARISON:  03/26/2020 FINDINGS: Lungs are well expanded, symmetric, and clear. No pneumothorax or pleural effusion. Cardiac size within normal limits. Pulmonary vascularity is normal. Osseous structures are age-appropriate. No acute bone abnormality. IMPRESSION: No active cardiopulmonary disease. Electronically Signed   By: Fidela Salisbury MD   On: 04/16/2020 21:32    Pertinent labs & imaging results that were available during my care of the patient were reviewed by me and considered in my medical decision making (see chart for details).  Medications Ordered in ED Medications  alum & mag hydroxide-simeth (MAALOX/MYLANTA) 200-200-20 MG/5ML suspension 30 mL (30 mLs Oral Given 04/17/20 0227)    And  lidocaine (XYLOCAINE) 2 % viscous mouth solution 15 mL (15 mLs Oral Given 04/17/20 0227)                                                                                                                                    Procedures Procedures  (including critical care time)  Medical Decision Making / ED Course I have reviewed the nursing notes for this encounter and the patient's prior records (if available in EHR or on provided paperwork).   Susan Frey was evaluated in Emergency Department on 04/17/2020 for the symptoms described in the history of present illness. She was evaluated in the context of the global COVID-19 pandemic, which necessitated consideration that the patient might be at risk for infection with the SARS-CoV-2 virus that causes COVID-19. Institutional protocols and algorithms that pertain to the evaluation of patients at risk for COVID-19 are in a state of rapid  change based on information released by regulatory bodies including the CDC and federal and state organizations. These policies and algorithms were followed during the patient's care in the ED.  Highly atypical chest pain.  EKG without acute ischemic changes or evidence of pericarditis. Troponins x2 . Feel this is sufficient to rule out ACS given the atypical nature of her symptoms and duration.  Low suspicion for aortic dissection or esophageal perforation.  Patient provided with GI cocktail which completely resolved her symptoms.  Chest x-ray without evidence suggestive of pneumonia, pneumothorax, pneumomediastinum.  No abnormal contour of the mediastinum to suggest dissection. No evidence of acute injuries.  Low suspicion for pulmonary emboli.  Dimer below the age adjusted threshold.  Final Clinical Impression(s) / ED Diagnoses Final diagnoses:  Atypical chest pain   The patient appears reasonably screened and/or stabilized for discharge and I doubt any other medical condition or other [facility] requiring further screening, evaluation, or treatment in the ED at this time prior to discharge. Safe for discharge with strict return precautions.  Disposition: Discharge  Condition: Good  I have discussed the results, Dx and Tx plan with the patient/family who expressed understanding and agree(s) with the plan. Discharge instructions discussed at length. The patient/family was given strict return precautions who verbalized understanding of the instructions. No further questions at time of discharge.    ED Discharge Orders    None       Follow Up: Drake Leach, MD [address] [place] [address] [phone]  Schedule an appointment as soon as possible for a visit  As needed      This chart was dictated using voice recognition software.  Despite best efforts to proofread,  errors can occur which can change the documentation meaning.   Fatima Blank, MD 04/17/20 [phone]

## 2020-04-23 ENCOUNTER — Other Ambulatory Visit: Payer: Self-pay

## 2020-04-23 NOTE — Patient Outreach (Signed)
Aging Gracefully Program  04/23/2020  NADENE WITHERSPOON [birth year]    [id number]   Care Coordination:  Placed call to patient and explained reason for call. Offered home visit for Wednesday and patient has accepted.  PLAN: home visit on 04/25/2020 @3pm . Confirmed address.  Tomasa Rand, RN, BSN, CEN [facility] [employer] [phone]

## 2020-04-23 NOTE — Patient Outreach (Signed)
Aging Gracefully Program  OT Initial Visit  03/28/2020  Susan Frey [birth year] [id number]  Visit:  1- Initial Visit  Start Time:  1600 End Time:  1700 Total Minutes:  54  CCAP: Typical Daily Routine: Typical Daily Routine:: Dialysis on M and F, no longer needs to go on W, helps drive her 2 younger sisters to appts, enjoys sitting on porch  What Types Of Care Problems Are You Having Throughout The Day?: none - if any assistance needed her son will help when he is home - he is not always home  What Kind Of Help Do You Receive?: heavy household chores  Do You Think You Need Other Types Of Help?: no What Do You Think Would Make Everyday Life Easier For You?: grab bars in bathroom and repairs to my floor and ceiling  What Is A Good Day Like?: minimal pain What Is A Bad Day Like?: fatigue from dialysis and shoulder pain Do You Have Time For Yourself?: yes  Patient Reported Equipment: Patient Reported Equipment Currently Used: Long Handle Sponge, Raised Toilet Seat Functional Mobility-Walking Indoors/Getting Around the [employer]:   Functional Mobility-Walk A Block: Walk A Block: No Difficulty Do You:: No Device/No Assistance Importance Of Learning New Strategies:: Not At All Functional Mobility-Maintain Balance While Showering: Maintaining Balance While Showering: A Little Difficulty Do You:: No Device/No Assistance Importance Of Learning New Strategies:: Very Much Observation: Maintain Balance While Showering: Independent With Pain, Difficulty, Or Use Of Device Safety: A Little Risk Efficiency: Somewhat Intervention: Yes Functional Mobility-Stooping, Crouching, Kneeling To Retreive Item: Stooping, Crouching, or Kneeling To Retrieve Item: A Little Difficulty Do You:: No Device/No Assistance Importance Of Learning New Strategies:: Very Much Observation: [place] Pretty, or Kneel: Supervision Safety: A Little Risk Efficiency: Somewhat Intervention: Yes Functional Mobility-Bending From  Standing Position To Pick Up Clothing Off The Floor: Bending Over From Standing Position To Pick Up Clothing Off The Floor: A Little Difficulty Do You:: No Device/No Assistance Importance Of Learning New Strategies:: Very Much Observation: Bending Over From Standing Postion To Pick Up Clothing Off Of Floor: Supervision Safety: A Little Risk Efficiency: Very Intervention: Yes Functional Mobility-Reaching For Items Above Shoulder Level: Reaching For Items Above Shoulder Level: No Difficulty Do You:: No Device/No Assistance Importance Of Learning New Strategies:: Not At All Functional Mobility-Climb 1 Flight Of Stairs: Climb 1 Flight Of Stairs: A Little Difficulty Do You:: No Device/No Assistance Importance Of Learning New Strategies:: Not At All Functional Mobility-Move In And Out Of Chair: Move In and Out Of A Chair: No Difficulty Do You:: No Device/No Assistance Importance Of Learning New Strategies:: Not At All Functional Mobility-Move In And Out Of Bed: Move In and Out Of Bed: No Difficulty Do You:: No Device/No Assistance Importance Of Learning New Strategies:: Not At All Functional Mobility-Move In And Out Of Bath/Shower: Move In And Out Of A Bath/Shower: A Little Difficulty Do You:: No Device/No Assistance Importance Of Learning New Strategies:: Very Much Observation: Move In And Out Of Bath/Shower: Independent With Pain, Difficulty, Or Use Of Device Safety: A Little Risk Efficiency: Somewhat Intervention: Yes Functional Mobility-Get On And Off Toilet: Getting Up From The Floor: Unable To Do Do You:: No Device/No Assistance Importance Of Learning New Strategies:: Very Much Functional Mobility-Into And Out Of Car, Not Including Driving: Into  And Out Of Car, Not Including Driving: No Difficulty Do You:: No Device/No Assistance Importance Of Learning New Strategies:: Not At All Functional Mobility-Other Mobility Difficulty:      Activities of Daily  Living-Bathing/Showering: ADL-Bathing/Showering: No Difficulty Do You:: Use A Device Importance Of Learning New Strategies: Not At All Other Comments:: has long handled sponge  Activities of Daily Living-Personal Hygiene and Grooming: [place] and Grooming: No Difficulty Do You:: No Device/No Assistance Importance Of Learning New Strategies: Not At All Activities of Daily Living-Toilet Hygiene: Toilet Hygiene: No Difficulty Do You:: No Device/No Assistance Importance Of Learning New Strategies: Not At All Activities of Daily Living-Put On And Take Off Undergarments (Incl. Fasteners): Put On And Take Off Undergarments (Incl. Fasteners): No Difficulty Do You:: No Device/No Assistance Importance Of Learning New Strategies: Not At All Activities of Daily Living-Put On And Take Off Shirt/Dress/Coat (Incl. Fasteners): Put On And Take Off Shirt/Dress/Coat (Incl. Fasteners): No Difficulty Do You:: No Device/No Assistance Importance Of Learning New Strategies: Not At All Activities of Daily Living-Put On And Take Off Socks And Shoes: Put On And Take Off Socks And  Shoes: No Difficulty Do You:: No Device/No Assistance Importance Of Learning New Strategies: Not At All Activities of Daily Living-Feed Self: Feed Self: No Difficulty Do You:: No Device/No Assistance Importance Of Learning New Strategies: Not At All Activities of Daily Living-Rest And Sleep: Rest and Sleep: No Difficulty Do You:: No Device/No Assistance Importance Of Learning New Strategies: Not At All Activities of Daily Living-Sexual Activity: Sexual  Activity: N/A Activities of Daily Living-Other Activity Identified:    Instrumental Activities of Daily Living-Light Homemaking (Laundry, Straightening Up, Vacuuming):  Do Light Homemaking (Laundry, Straightening Up, Vacuuming): No Difficulty Do You:: No Device/No Assistance Importance Of Learning New Strategies: Not At All Instrumental Activities of Daily  Living-Making A Bed: Making a Bed: No Difficulty Do You:: No Device/No Assistance Importance Of Learning New Strategies: Not At All Instrumental Activities of Daily Living-Washing Dishes By Hand While Standing At The Sink: Washing Dishes By Hand While Standing At The Sink: No Difficulty Do You:: No Device/No Assistance Importance Of Learning New Strategies: Not At All Instrumental Activities of Daily Living-Grocery Shopping: Do Grocery Shopping: No Difficulty Do You:: No Device/No Assistance Importance Of Learning New Strategies: Not At All Instrumental Activities of Daily Living-Use Telephone: Use Telephone: No Difficulty Do You:: No Device/No Assistance Importance Of Learning New Strategies: Not At All Instrumental Activities of Daily Living-Financial Management: Financial Management: N/A Do You:: Use Personal Assistance Importance Of Learning New Strategies: N/A Other Comments:: daughter completes  Instrumental Activities of Daily Living-Medications: Take Medications: No Difficulty Do You:: Use Personal Assistance Importance Of Learning New Strategies: Not At All Other Comments:: daughter sets up Instrumental Activities of Daily Living-Health Management And Maintenance: Health Management & Maintenance: No Difficulty Do You:: Use Personal Assistance Importance Of Learning New Strategies: Not At All Other Comments:: daughter assists  Instrumental Activities of Daily Living-Meal Preparation and Clean-Up: Meal Preparation and Clean-Up: No Difficulty Do You:: No Device/No Assistance Importance Of Learning New Strategies: Not At All Instrumental Activities of Daily Living-Provide Care For Others/Pets: Care For Others/Pets: N/A Instrumental Activities of Daily Living-Take Part In Organized Social Activities: Take Part In Organized Social Activities: No Difficulty Do You:: No Device/No Assistance Importance Of Learning New Strategies: Not At All Instrumental Activities of Daily  Living-Leisure Participation: Leisure Participation: No Difficulty Do You:: No Device/No Assistance Importance Of Learning New Strategies: Not At All Instrumental Activities of Daily Living-Employment/Volunteer Activities: Employment/Volunteer Activities: N/A Instrumental Activities of Daily Living-Other Identifies:    Readiness To Change Score:  Readiness to Change Score: 8.67  Home Environment Assessment: Outside Home Entry:: front door - could use  1 -2 handrails on front steps, back door - screen door needs replaced Entryway/Foyer:: poor lighting Dining Room:: what was dining room is in poor repair - ceiling is falling in and flooring is in bad shape room is not usable. Living Room:: both living rooms accessible Kitchen:: cabinetry on bottom is in need of repair, refrigerator is in hall way and freezers are on back porch Stairs:: narrow stair case to second level - handrail intact, stairs carpeted, only goes upstairs occassionally to pick out church clothes Bathroom:: narrow - would benefit from grab bars at entry and back wall of shower. Master Bedroom:: accessible Laundry:: accessible Basement:: n/a Hallways:: would benefit from improved lighting Smoke/CO2 Detector:: unsure Veterinary surgeon:: unsure Mailbox:: accessible Other Home Environment Concerns:: flooring and ceiling throughout home are in very poor condition - may benefit from repair for safety not related to [employer]  Durable Medical Equipment:    Patient Education: Education Provided: Yes Education Details: provided home safety checklist booklet and reviewed with patient Person(s) Educated: Patient Comprehension: Verbalized Understanding  Goals: Goals Addressed            This Visit's Progress   . Patient Stated       Improve safety retrieving items off of the floor.     . Patient Stated       Improve safety and confidence with shower transfers and balance while showering.    . Patient Stated        Improve safety and independence entering and exiting her home.     . Patient Stated       Learn ways to safely get off of the floor after a fall.        Post Clinical Reasoning: Clinician View Of Client Situation:: patient is fairing exceptionally well for her age.  she is most interested in bathroom safety and was quite receptive to recommendations for other rooms in her home. Client View Of His/Her Situation:: doing well and realizes modifications in bathroom and entry ways of home will be beneficial. Next Visit Plan:: Review goals, educate on safe get up from falls, brainstorm goal 1 - how to safely retrieve items off of the floor.  [name], [place], OTR/L [phone]

## 2020-04-24 ENCOUNTER — Other Ambulatory Visit: Payer: Self-pay | Admitting: Specialist

## 2020-04-25 ENCOUNTER — Other Ambulatory Visit: Payer: Self-pay

## 2020-04-25 NOTE — Patient Outreach (Signed)
Aging Gracefully Program  OT Follow-Up Visit  04/25/2020  Susan Frey Aug 05, 1932 [id number]  Visit:  2- Second Visit  Start Time:  8466 End Time:  5993 Total Minutes:  35  Durable Medical Equipment: Adaptive Equipment: Reacher Adaptive Equipment Distribution Date: 04/24/20  Patient Education: Education Provided: Yes Education Details: educated patient on how to get up from a fall safely and patient voiced understanding.  I had the opportunity to speak to patient's daughter over the phone and explain what the Aging Gracefully program is and what goals [name] had selected.  Daughter was very Patent attorney of the program. Person(s) Educated: Patient, Child(ren) Comprehension: Verbalized Understanding, Returned Demonstration  Goals:  Goals Addressed            This Visit's Progress   . Patient Stated   On track    Improve safety retrieving items off of the floor.  ACTION PLANNING - CUSTOM  Target Problem Area: Difficult to retrieve items off of the floor   Why Problem May Occur: Doesn't move around as easy as she used to Joints are sore Shoulder hurts when overreaching Afraid she will fall       Target Goal:  retrieve items off of the floor safely.      STRATEGIES Saving Your Energy: DO: DON'T:  Use a reacher to pick up items off of the floor or overhead Over reach   Use reacher to get items out of dryer  Lose balance while attempting to retrieve an item   Use reacher to turn on and off overhead lights          Modifying your home environment and making it safe: DO: DON'T:                  Simplifying the way you set up tasks or daily routines: DO: DON'T:            Practice It is important to practice the strategies so we can determine if they will be effective in helping to reach your goal. Follow these specific recommendations: 1. Use reacher - given 2 keep 1 in each room that you spend the most time in.  2. 3.  If a strategy does not work  the first time, try it again and again (and maybe again). We may make some changes over the next few sessions, based on how they work.           Post Clinical Reasoning: Client Action (Goal) One Interventions: Patient will improve independence and safety retrieving items off of the floor. Did Client Try?: Yes Targeted Problem Area Status: A Lot Better   Next session:  Shower safety [name], [place], OTR/L [phone]

## 2020-04-25 NOTE — Patient Outreach (Signed)
Aging Gracefully Program  RN Visit  04/25/2020  Susan Frey 09-Feb-1933 [id number]  Visit:   RN home visit #1  Start Time:   245 End Time:   345 Total Minutes:   81 Patient is awake and alert. Able to correctly answer day of the week, month, year and president Height 62 inches Weight 120 pounds. Dialysis Monday and Friday- drives self.  Readiness To Change Score:  Readiness to Change Score: 8.67  Universal RN Interventions: Calendar Distribution: Yes Exercise Review: No Medications: Yes Medication Changes: Yes Mood: Yes Pain: Yes PCP Advocacy/Support: Yes Fall Prevention: Yes Incontinence: Yes Clinician View Of Client Situation: Client ambuoatory to door when I arrived. Had her mask in place. Ambualting without any assistive devices. Patient reports her son lives with her at times.  Reports she is not afraid to live alone. Reports she eats 2 meals per day. reports she drives herself to dialysis. Client View Of His/Her Situation: Patient reports she was having some left breast/ chest pain and went to the hospital about 2 weeks ago.  Reports diagnosed with reflux.  Reports pain is gone.  Patient reports she drives herself to dialysis 2 times per week. report she cooks for herself and manages her house. reports good family support. Son mows the grass.  Patient reports problems with bathing. having to step over bathrub.  rpeort uneven floors .  Healthcare Provider Communication: Did Higher education careers adviser With [employer] Provider?: No  Clinician View of Client Situation: Clinician View Of Client Situation: Client ambuoatory to door when I arrived. Had her mask in place. Ambualting without any assistive devices. Patient reports her son lives with her at times.  Reports she is not afraid to live alone. Reports she eats 2 meals per day. reports she drives herself to dialysis. Client's View of His/Her Situation: Client View Of His/Her Situation: Patient reports she was having some left  breast/ chest pain and went to the hospital about 2 weeks ago.  Reports diagnosed with reflux.  Reports pain is gone.  Patient reports she drives herself to dialysis 2 times per week. report she cooks for herself and manages her house. reports good family support. Son mows the grass.  Patient reports problems with bathing. having to step over bathrub.  rpeort uneven floors .  Medication Assessment: Do You Have Any Problems Paying For Medications?: No Where Does Client Store Medications?: Other: (bedroom) Can Client Read Pill Bottles?: Yes Does Client Use A Pillbox?: No Does Anyone Assist Client In Taking Medications?: No Do You Take Vitamin D?: Yes Does Client Have Any Questions Or Concerns About Medictions?: No Is Client Complaining Of Any Symptoms That Could Be Side Effects To Medications?: No Any Possible Changes In Medication Regimen?: No   Outpatient Encounter Medications as of 04/25/2020  Medication Sig   amLODipine (NORVASC) 5 MG tablet Take 5 mg by mouth daily.   aspirin 81 MG EC tablet Take by mouth.   calcium carbonate (TUMS EX) 750 MG chewable tablet Chew by mouth.   diclofenac Sodium (VOLTAREN) 1 % GEL APPLY EXTERNALLY TO THE AFFECTED AREA THREE TIMES DAILY AS NEEDED   omeprazole (PRILOSEC) 40 MG capsule Take by mouth.   rosuvastatin (CRESTOR) 10 MG tablet Take 10 mg by mouth at bedtime.   traMADol (ULTRAM) 50 MG tablet Take 50 mg by mouth 3 (three) times daily as needed.   albuterol (VENTOLIN HFA) 108 (90 Base) MCG/ACT inhaler Inhale into the lungs. (Patient not taking: Reported on 04/25/2020)   traMADol (  ULTRAM-ER) 200 MG 24 hr tablet Take 200 mg by mouth daily. Patient states she takes as needed. (Patient not taking: Reported on 04/25/2020)   warfarin (COUMADIN) 5 MG tablet Take 5 mg by mouth daily. Patient states she takes at night (Patient not taking: Reported on 04/25/2020)   No facility-administered encounter medications on file as of 04/25/2020.    Session  Summary: Patient living mostly alone. Son stays some nights.  On dialysis. Drives self.  Complains of pain in neck and left shoulder. Reviewed otc and prescribed RX.   Goals Addressed            This Visit's Progress    Patient Stated       Patient will report decreased pain in the neck and left shoulder in the next 60 days.  04/25/2020  Reviewed with patient the use of her muscle rubs (voltaren gel)  encouraged patient to gently stretch and take OTC meds approved by MD.   Tomasa Rand, RN, BSN, CEN [place] Coordinator [phone]        Next home visit planned for 10/13/2021at 3pm.   Tomasa Rand, RN, BSN, CEN [facility] [employer] [phone]

## 2020-05-03 DIAGNOSIS — N186 End stage renal disease: Secondary | ICD-10-CM | POA: Diagnosis not present

## 2020-05-03 DIAGNOSIS — Z992 Dependence on renal dialysis: Secondary | ICD-10-CM | POA: Diagnosis not present

## 2020-05-04 DIAGNOSIS — I10 Essential (primary) hypertension: Secondary | ICD-10-CM | POA: Diagnosis not present

## 2020-05-04 DIAGNOSIS — N2581 Secondary hyperparathyroidism of renal origin: Secondary | ICD-10-CM | POA: Diagnosis not present

## 2020-05-04 DIAGNOSIS — D509 Iron deficiency anemia, unspecified: Secondary | ICD-10-CM | POA: Diagnosis not present

## 2020-05-04 DIAGNOSIS — N186 End stage renal disease: Secondary | ICD-10-CM | POA: Diagnosis not present

## 2020-05-04 DIAGNOSIS — D631 Anemia in chronic kidney disease: Secondary | ICD-10-CM | POA: Diagnosis not present

## 2020-05-07 DIAGNOSIS — Z23 Encounter for immunization: Secondary | ICD-10-CM | POA: Diagnosis not present

## 2020-05-07 DIAGNOSIS — I10 Essential (primary) hypertension: Secondary | ICD-10-CM | POA: Diagnosis not present

## 2020-05-07 DIAGNOSIS — E1122 Type 2 diabetes mellitus with diabetic chronic kidney disease: Secondary | ICD-10-CM | POA: Diagnosis not present

## 2020-05-16 ENCOUNTER — Other Ambulatory Visit: Payer: Self-pay

## 2020-05-17 NOTE — Patient Outreach (Signed)
Aging Gracefully Program  RN Visit  05/17/2020  Susan Frey 1933/07/26 [id number]  Visit:   RN visit #2  Start Time:   1450 End Time:   1520 Total Minutes:   30  Readiness To Change Score:     Universal RN Interventions: Calendar Distribution: Yes Exercise Review: Yes Medications: Yes Medication Changes: Yes Mood: Yes Pain: Yes PCP Advocacy/Support: No Fall Prevention: Yes Incontinence: Yes Clinician View Of Client Situation: Arrive to find patient standing outside walking around talking on phone. reports she forgot I was coming.Ambulating without any assistive devices. Client View Of His/Her Situation: Patient reports home modifications have not started yet and she is unsure what the plan in .  Reports she is doing well. Denies any changes in medications.  Denies any new falls. Reports that she continues to have neck and back pain.  Reports pain 1/10.  Reports she took a pill for it this am.  Reports she is eating, drinking and slepping well.  denies any urinary or bowel concerns today.  Healthcare Provider Communication:  none  Clinician View of Client Situation: Clinician View Of Client Situation: Arrive to find patient standing outside walking around talking on phone. reports she forgot I was coming.Ambulating without any assistive devices. Client's View of His/Her Situation: Client View Of His/Her Situation: Patient reports home modifications have not started yet and she is unsure what the plan in .  Reports she is doing well. Denies any changes in medications.  Denies any new falls. Reports that she continues to have neck and back pain.  Reports pain 1/10.  Reports she took a pill for it this am.  Reports she is eating, drinking and slepping well.  denies any urinary or bowel concerns today.  Medication Assessment:no changes in medications     Session Summary:  Goals Addressed            This Visit's Progress   . Patient Stated       Patient will report  decreased pain in the neck and left shoulder in the next 60 days.  04/25/2020  Reviewed with patient the use of her muscle rubs (voltaren gel)  encouraged patient to gently stretch and take OTC meds approved by MD.   Tomasa Rand, RN, BSN, CEN [place] Coordinator [phone]    05/16/2020  Home visit completed. Reviewed pain level. Provided written AG exercises. Reviewed and demonstrated all exercises. Patient was able to correctly demonstrate the exercises back to me.  Encouraged patient to complete exercises daily.   Tomasa Rand, RN, BSN, CEN [facility] [phone]        Next home visit planned for November 10th at 2pm.  Wrote appointment on written home exercise plan.  Tomasa Rand, RN, BSN, CEN [facility] [employer] [phone]

## 2020-06-03 DIAGNOSIS — N186 End stage renal disease: Secondary | ICD-10-CM | POA: Diagnosis not present

## 2020-06-03 DIAGNOSIS — Z992 Dependence on renal dialysis: Secondary | ICD-10-CM | POA: Diagnosis not present

## 2020-06-04 DIAGNOSIS — N186 End stage renal disease: Secondary | ICD-10-CM | POA: Diagnosis not present

## 2020-06-04 DIAGNOSIS — D631 Anemia in chronic kidney disease: Secondary | ICD-10-CM | POA: Diagnosis not present

## 2020-06-04 DIAGNOSIS — E1122 Type 2 diabetes mellitus with diabetic chronic kidney disease: Secondary | ICD-10-CM | POA: Diagnosis not present

## 2020-06-04 DIAGNOSIS — N2581 Secondary hyperparathyroidism of renal origin: Secondary | ICD-10-CM | POA: Diagnosis not present

## 2020-06-04 DIAGNOSIS — D509 Iron deficiency anemia, unspecified: Secondary | ICD-10-CM | POA: Diagnosis not present

## 2020-06-07 ENCOUNTER — Telehealth (HOSPITAL_COMMUNITY): Payer: Self-pay | Admitting: Specialist

## 2020-06-07 NOTE — Telephone Encounter (Signed)
OT has attempted to contact Ms. Engert to schedule next OT visit for Aging Gracefully and phone rings out of service.   Calls made on 11/2, 11/3, and 11/4 with same outcome.   [name], [place], OTR/L [phone]

## 2020-06-13 ENCOUNTER — Other Ambulatory Visit: Payer: Self-pay

## 2020-06-14 NOTE — Patient Outreach (Signed)
Aging Gracefully Program  RN Visit  06/14/2020  Susan Frey Jul 28, 1933 [id number]  Visit:   RN visit #3 and final.   Start Time:   1350 End Time:   1430 Total Minutes:   40  Readiness To Change Score:     Universal RN Interventions: Calendar Distribution: Yes Exercise Review: Yes Medications: Yes Medication Changes: Yes Mood: Yes Pain: Yes Fall Prevention: Yes Incontinence: Yes Clinician View Of Client Situation: Patient answered the door without problems. very talkative today. Home clean. Client View Of His/Her Situation: Patient reports to me that she having severe left neck pain. reports a home visiting MD is coming tomorrow. reports is she hold a certain place in her neck it feels better. Reports pain is unrelieved by pain medications. Patient happy with grab bar attched to tub.   Patient reports railing at front door are very helpful. States her legs are stronger with home exercise plan.  At this time patient is not doing neck or arm exercises due to neck pain.  I agreed with patient to just focus on her legs.  Denies any falls.  Healthcare Provider Communication:none    Clinician View of Client Situation: Clinician View Of Client Situation: Patient answered the door without problems. very talkative today. Home clean. Client's View of His/Her Situation: Client View Of His/Her Situation: Patient reports to me that she having severe left neck pain. reports a home visiting MD is coming tomorrow. reports is she hold a certain place in her neck it feels better. Reports pain is unrelieved by pain medications. Patient happy with grab bar attched to tub.   Patient reports railing at front door are very helpful. States her legs are stronger with home exercise plan.  At this time patient is not doing neck or arm exercises due to neck pain.  I agreed with patient to just focus on her legs.  Denies any falls.  Medication Assessment: denies changes. Using muscle rubs.    RN Update:  Home visit MD to assess neck pain on 06/14/2020.  Unable to do neck and arm exercises. Able to complete legs strengthening exercises.   Session Summary: Patient happy with progress with strength other than neck pain. Reports grab bar for tub is helpful and rails outside of home are helpful as well.    Goals Addressed            This Visit's Progress   . COMPLETED: Patient Stated       Patient will report decreased pain in the neck and left shoulder in the next 60 days.  04/25/2020  Reviewed with patient the use of her muscle rubs (voltaren gel)  encouraged patient to gently stretch and take OTC meds approved by MD.   Tomasa Rand, RN, BSN, CEN [place] Coordinator [phone]    05/16/2020  Home visit completed. Reviewed pain level. Provided written AG exercises. Reviewed and demonstrated all exercises. Patient was able to correctly demonstrate the exercises back to me.  Encouraged patient to complete exercises daily.   Tomasa Rand, RN, BSN, CEN [place] Coordinator [phone]   06/13/2020  Goal not met but patient has a plan. MD follow up  on 06/14/2020.  Will close goal out.        Closed to nursing.  Will communicate with OT.  Tomasa Rand, RN, BSN, CEN [facility] [employer] [phone]

## 2020-06-19 DIAGNOSIS — H524 Presbyopia: Secondary | ICD-10-CM | POA: Diagnosis not present

## 2020-06-19 DIAGNOSIS — Z7984 Long term (current) use of oral hypoglycemic drugs: Secondary | ICD-10-CM | POA: Diagnosis not present

## 2020-06-19 DIAGNOSIS — Z961 Presence of intraocular lens: Secondary | ICD-10-CM | POA: Diagnosis not present

## 2020-06-19 DIAGNOSIS — H52203 Unspecified astigmatism, bilateral: Secondary | ICD-10-CM | POA: Diagnosis not present

## 2020-06-19 DIAGNOSIS — H5211 Myopia, right eye: Secondary | ICD-10-CM | POA: Diagnosis not present

## 2020-06-19 DIAGNOSIS — E119 Type 2 diabetes mellitus without complications: Secondary | ICD-10-CM | POA: Diagnosis not present

## 2020-06-19 DIAGNOSIS — H40033 Anatomical narrow angle, bilateral: Secondary | ICD-10-CM | POA: Diagnosis not present

## 2020-06-19 DIAGNOSIS — H5202 Hypermetropia, left eye: Secondary | ICD-10-CM | POA: Diagnosis not present

## 2020-06-19 DIAGNOSIS — E113293 Type 2 diabetes mellitus with mild nonproliferative diabetic retinopathy without macular edema, bilateral: Secondary | ICD-10-CM | POA: Diagnosis not present

## 2020-07-03 DIAGNOSIS — Z992 Dependence on renal dialysis: Secondary | ICD-10-CM | POA: Diagnosis not present

## 2020-07-03 DIAGNOSIS — N186 End stage renal disease: Secondary | ICD-10-CM | POA: Diagnosis not present

## 2020-07-06 DIAGNOSIS — E1122 Type 2 diabetes mellitus with diabetic chronic kidney disease: Secondary | ICD-10-CM | POA: Diagnosis not present

## 2020-07-06 DIAGNOSIS — D631 Anemia in chronic kidney disease: Secondary | ICD-10-CM | POA: Diagnosis not present

## 2020-07-06 DIAGNOSIS — N186 End stage renal disease: Secondary | ICD-10-CM | POA: Diagnosis not present

## 2020-07-06 DIAGNOSIS — N2581 Secondary hyperparathyroidism of renal origin: Secondary | ICD-10-CM | POA: Diagnosis not present

## 2020-07-06 DIAGNOSIS — D509 Iron deficiency anemia, unspecified: Secondary | ICD-10-CM | POA: Diagnosis not present

## 2020-07-11 DIAGNOSIS — T82898A Other specified complication of vascular prosthetic devices, implants and grafts, initial encounter: Secondary | ICD-10-CM | POA: Diagnosis not present

## 2020-07-11 DIAGNOSIS — T82848D Pain from vascular prosthetic devices, implants and grafts, subsequent encounter: Secondary | ICD-10-CM | POA: Diagnosis not present

## 2020-07-11 DIAGNOSIS — N186 End stage renal disease: Secondary | ICD-10-CM | POA: Diagnosis not present

## 2020-07-11 DIAGNOSIS — Z992 Dependence on renal dialysis: Secondary | ICD-10-CM | POA: Diagnosis not present

## 2020-07-12 ENCOUNTER — Encounter: Payer: Self-pay | Admitting: Specialist

## 2020-07-18 ENCOUNTER — Encounter: Payer: Self-pay | Admitting: Specialist

## 2020-07-18 DIAGNOSIS — M542 Cervicalgia: Secondary | ICD-10-CM | POA: Diagnosis not present

## 2020-07-18 DIAGNOSIS — G8929 Other chronic pain: Secondary | ICD-10-CM | POA: Diagnosis not present

## 2020-07-24 ENCOUNTER — Telehealth (HOSPITAL_COMMUNITY): Payer: Self-pay | Admitting: Specialist

## 2020-07-24 NOTE — Telephone Encounter (Signed)
Called Mrs. Pauwels attempting to schedule Aging Gracefully OT visit.  Patient not at home and individual that answered the phone stated there had been a death in the family.  I stated that I would call back next week to attempt to schedule our next AG OT visit. [name], [place], OTR/L [phone]

## 2020-08-03 DIAGNOSIS — Z992 Dependence on renal dialysis: Secondary | ICD-10-CM | POA: Diagnosis not present

## 2020-08-03 DIAGNOSIS — N186 End stage renal disease: Secondary | ICD-10-CM | POA: Diagnosis not present

## 2020-08-06 DIAGNOSIS — D631 Anemia in chronic kidney disease: Secondary | ICD-10-CM | POA: Diagnosis not present

## 2020-08-06 DIAGNOSIS — D509 Iron deficiency anemia, unspecified: Secondary | ICD-10-CM | POA: Diagnosis not present

## 2020-08-06 DIAGNOSIS — I10 Essential (primary) hypertension: Secondary | ICD-10-CM | POA: Diagnosis not present

## 2020-08-06 DIAGNOSIS — N186 End stage renal disease: Secondary | ICD-10-CM | POA: Diagnosis not present

## 2020-08-06 DIAGNOSIS — N2581 Secondary hyperparathyroidism of renal origin: Secondary | ICD-10-CM | POA: Diagnosis not present

## 2020-08-06 DIAGNOSIS — E1122 Type 2 diabetes mellitus with diabetic chronic kidney disease: Secondary | ICD-10-CM | POA: Diagnosis not present

## 2020-08-08 ENCOUNTER — Other Ambulatory Visit: Payer: Self-pay | Admitting: Specialist

## 2020-08-08 ENCOUNTER — Other Ambulatory Visit: Payer: Self-pay

## 2020-08-08 NOTE — Patient Outreach (Signed)
Aging Gracefully Program  OT FINAL Visit  08/08/2020  Susan Frey [birth year] [id number]  Visit:  3- Third Visit  Start Time:  9528 End Time:  1215 Total Minutes:  40   Patient Education: Education Provided: Yes Education Details: reviewed all 4 OT goals and discussed progress towards goals.  Reviewed how to safely enter/exit the tub using the grab bar.  Educated patient on use of "Tips for Aging at Home" book Person(s) Educated: Patient Comprehension: Verbalized Understanding,Returned Demonstration  Goals: Goals Addressed            This Visit's Progress   . COMPLETED: Patient Stated       Improve safety retrieving items off of the floor.  ACTION PLANNING - CUSTOM  Target Problem Area: Difficult to retrieve items off of the floor   Why Problem May Occur: Doesn't move around as easy as she used to Joints are sore Shoulder hurts when overreaching Afraid she will fall       Target Goal:  retrieve items off of the floor safely.      STRATEGIES Saving Your Energy: DO: DON'T:  Use a reacher to pick up items off of the floor or overhead Over reach   Use reacher to get items out of dryer  Lose balance while attempting to retrieve an item   Use reacher to turn on and off overhead lights          Modifying your home environment and making it safe: DO: DON'T:                  Simplifying the way you set up tasks or daily routines: DO: DON'T:            Practice It is important to practice the strategies so we can determine if they will be effective in helping to reach your goal. Follow these specific recommendations: 1. Use reacher - given 2 keep 1 in each room that you spend the most time in.  2. 3.  If a strategy does not work the first time, try it again and again (and maybe again). We may make some changes over the next few sessions, based on how they work.        . COMPLETED: Patient Stated   On track    Improve safety and confidence with  shower transfers and balance while showering.    . COMPLETED: Patient Stated       Improve safety and independence entering and exiting her home.     . COMPLETED: Patient Stated   On track    Learn ways to safely get off of the floor after a fall.       ACTION PLANNING - FUNCTIONAL MOBILITY Target Problem Area: Difficulty getting in and out of house and tub safely  Why Problem May Occur:  No supports to hold onto - tub  Was overreaching to sink vanity.  Front and back entrance had multiple steps with no hand rails on either side.       Target Goal: Improve safety and independence with entering and exiting home    STRATEGIES Saving Your Energy: DO: DON'T:  Take breaks    Raise the height of surfaces    Take frequent rests. Just taking 15 minutes in a comfortable chair before becoming fatigued may help to restore your energy   Remove tripping hazards     Other    Modifying your home environment and making it safe: DO: DON'T:  Install  grab bars in the bathroom Hold onto unsafe surfaces (towel racks, shower curtains, soap dishes, etc)  Remove or strongly secure throw rugs    Provide adequate lighting Use dim lights or lights that cast a lot of shadows  Other use grab bars in shower   Other use hand rails at front and back entrance    Simplifying the way you set up tasks or daily routines: DO: DON'T:  Move slowly [place] during transfers or walking   Other   Other    Practice It is important to practice the strategies so we can determine if they will be effective in helping to reach your goal. Follow these specific recommendations: 1. Use handrails and grab bars 2. 3.  If a strategy does not work the first time, try it again and again (and maybe again). We may make some changes over the next few sessions, based on how they work.        Post Clinical Reasoning: Client Action (Goal) One Interventions: Patient will improve safety and independence entering and  exitng her shower. Did Client Try?: Yes Targeted Problem Area Status: A Lot Better Client Action (Goal) Two Interventions: Paitent will improve safety entering and exiting her home via the front and rear entrance. Did Client Try?: Yes Targeted Problem Area Status: A Lot Better Clinician View Of Client Situation:: Patient continues to function at an extremely high level.  She is using the adaptive equipment and modifications added by the [place] program and is extremely thankful for these additions. Client View Of His/Her Situation:: with the new modifications to her home she feels much safer when entering and exiting her home and when getting in and out of the shower. Next Visit Plan:: DC from skilled OT intervention this date.  [name], [place], OTR/L [phone]

## 2020-08-16 DIAGNOSIS — M7918 Myalgia, other site: Secondary | ICD-10-CM | POA: Diagnosis not present

## 2020-08-16 DIAGNOSIS — I6529 Occlusion and stenosis of unspecified carotid artery: Secondary | ICD-10-CM | POA: Diagnosis not present

## 2020-08-16 DIAGNOSIS — M47812 Spondylosis without myelopathy or radiculopathy, cervical region: Secondary | ICD-10-CM | POA: Diagnosis not present

## 2020-08-16 DIAGNOSIS — M542 Cervicalgia: Secondary | ICD-10-CM | POA: Diagnosis not present

## 2020-08-16 DIAGNOSIS — M4802 Spinal stenosis, cervical region: Secondary | ICD-10-CM | POA: Diagnosis not present

## 2020-08-31 ENCOUNTER — Other Ambulatory Visit: Payer: Self-pay

## 2020-08-31 NOTE — Patient Outreach (Signed)
Aging Gracefully Program  08/31/2020  EVANY GOLEMAN 1933-03-23 [id number]   [facility] Evaluation Interviewer attempted to call patient on today regarding 5 month follow up. No answer from patient after multiple rings. CMA left confidential voicemail to patient to return call.  Will attempt to call back within 1 week.  [place] Management Assistant [phone]

## 2020-09-03 DIAGNOSIS — N186 End stage renal disease: Secondary | ICD-10-CM | POA: Diagnosis not present

## 2020-09-03 DIAGNOSIS — Z992 Dependence on renal dialysis: Secondary | ICD-10-CM | POA: Diagnosis not present

## 2020-09-06 ENCOUNTER — Other Ambulatory Visit: Payer: Self-pay

## 2020-09-06 DIAGNOSIS — I6523 Occlusion and stenosis of bilateral carotid arteries: Secondary | ICD-10-CM | POA: Diagnosis not present

## 2020-09-06 NOTE — Patient Outreach (Signed)
Aging Gracefully Program  09/06/2020  Susan Frey 09-16-1932 [id number]  [facility] Evaluation Interviewer made contact with patient. Aging Gracefully 5 month survey completed.   Interviewer will send follow up email to Darylene Price at [employer].   [place] Management Assistant [phone]

## 2020-09-07 DIAGNOSIS — N2581 Secondary hyperparathyroidism of renal origin: Secondary | ICD-10-CM | POA: Diagnosis not present

## 2020-09-07 DIAGNOSIS — D509 Iron deficiency anemia, unspecified: Secondary | ICD-10-CM | POA: Diagnosis not present

## 2020-09-07 DIAGNOSIS — N186 End stage renal disease: Secondary | ICD-10-CM | POA: Diagnosis not present

## 2020-09-07 DIAGNOSIS — D631 Anemia in chronic kidney disease: Secondary | ICD-10-CM | POA: Diagnosis not present

## 2020-09-07 DIAGNOSIS — E1122 Type 2 diabetes mellitus with diabetic chronic kidney disease: Secondary | ICD-10-CM | POA: Diagnosis not present

## 2020-09-18 DIAGNOSIS — R1012 Left upper quadrant pain: Secondary | ICD-10-CM | POA: Diagnosis not present

## 2020-09-18 DIAGNOSIS — I700 Atherosclerosis of aorta: Secondary | ICD-10-CM | POA: Diagnosis not present

## 2020-09-18 DIAGNOSIS — N289 Disorder of kidney and ureter, unspecified: Secondary | ICD-10-CM | POA: Diagnosis not present

## 2020-09-18 DIAGNOSIS — I491 Atrial premature depolarization: Secondary | ICD-10-CM | POA: Diagnosis not present

## 2020-09-18 DIAGNOSIS — R0789 Other chest pain: Secondary | ICD-10-CM | POA: Diagnosis not present

## 2020-09-18 DIAGNOSIS — R079 Chest pain, unspecified: Secondary | ICD-10-CM | POA: Diagnosis not present

## 2020-09-18 DIAGNOSIS — I440 Atrioventricular block, first degree: Secondary | ICD-10-CM | POA: Diagnosis not present

## 2020-09-18 DIAGNOSIS — K7689 Other specified diseases of liver: Secondary | ICD-10-CM | POA: Diagnosis not present

## 2020-09-19 DIAGNOSIS — H903 Sensorineural hearing loss, bilateral: Secondary | ICD-10-CM | POA: Diagnosis not present

## 2020-10-01 DIAGNOSIS — Z992 Dependence on renal dialysis: Secondary | ICD-10-CM | POA: Diagnosis not present

## 2020-10-01 DIAGNOSIS — N186 End stage renal disease: Secondary | ICD-10-CM | POA: Diagnosis not present

## 2020-10-05 DIAGNOSIS — N2581 Secondary hyperparathyroidism of renal origin: Secondary | ICD-10-CM | POA: Diagnosis not present

## 2020-10-05 DIAGNOSIS — D631 Anemia in chronic kidney disease: Secondary | ICD-10-CM | POA: Diagnosis not present

## 2020-10-05 DIAGNOSIS — E1122 Type 2 diabetes mellitus with diabetic chronic kidney disease: Secondary | ICD-10-CM | POA: Diagnosis not present

## 2020-10-05 DIAGNOSIS — D509 Iron deficiency anemia, unspecified: Secondary | ICD-10-CM | POA: Diagnosis not present

## 2020-10-05 DIAGNOSIS — N186 End stage renal disease: Secondary | ICD-10-CM | POA: Diagnosis not present

## 2020-10-08 DIAGNOSIS — R0781 Pleurodynia: Secondary | ICD-10-CM | POA: Diagnosis not present

## 2020-10-08 DIAGNOSIS — K219 Gastro-esophageal reflux disease without esophagitis: Secondary | ICD-10-CM | POA: Diagnosis not present

## 2020-10-17 DIAGNOSIS — M25512 Pain in left shoulder: Secondary | ICD-10-CM | POA: Diagnosis not present

## 2020-10-17 DIAGNOSIS — M4802 Spinal stenosis, cervical region: Secondary | ICD-10-CM | POA: Diagnosis not present

## 2020-10-17 DIAGNOSIS — M47812 Spondylosis without myelopathy or radiculopathy, cervical region: Secondary | ICD-10-CM | POA: Diagnosis not present

## 2020-10-17 DIAGNOSIS — Q761 Klippel-Feil syndrome: Secondary | ICD-10-CM | POA: Diagnosis not present

## 2020-10-19 DIAGNOSIS — D631 Anemia in chronic kidney disease: Secondary | ICD-10-CM | POA: Diagnosis not present

## 2020-10-25 DIAGNOSIS — I10 Essential (primary) hypertension: Secondary | ICD-10-CM | POA: Diagnosis not present

## 2020-10-25 DIAGNOSIS — R42 Dizziness and giddiness: Secondary | ICD-10-CM | POA: Diagnosis not present

## 2020-10-31 IMAGING — CR DG CHEST 2V
2 series · 2 of 2 positions shown · non-contrast
Comparison: 03/26/2020

CLINICAL DATA: Chest pain

EXAM:
CHEST - 2 VIEW

[w chest pa]
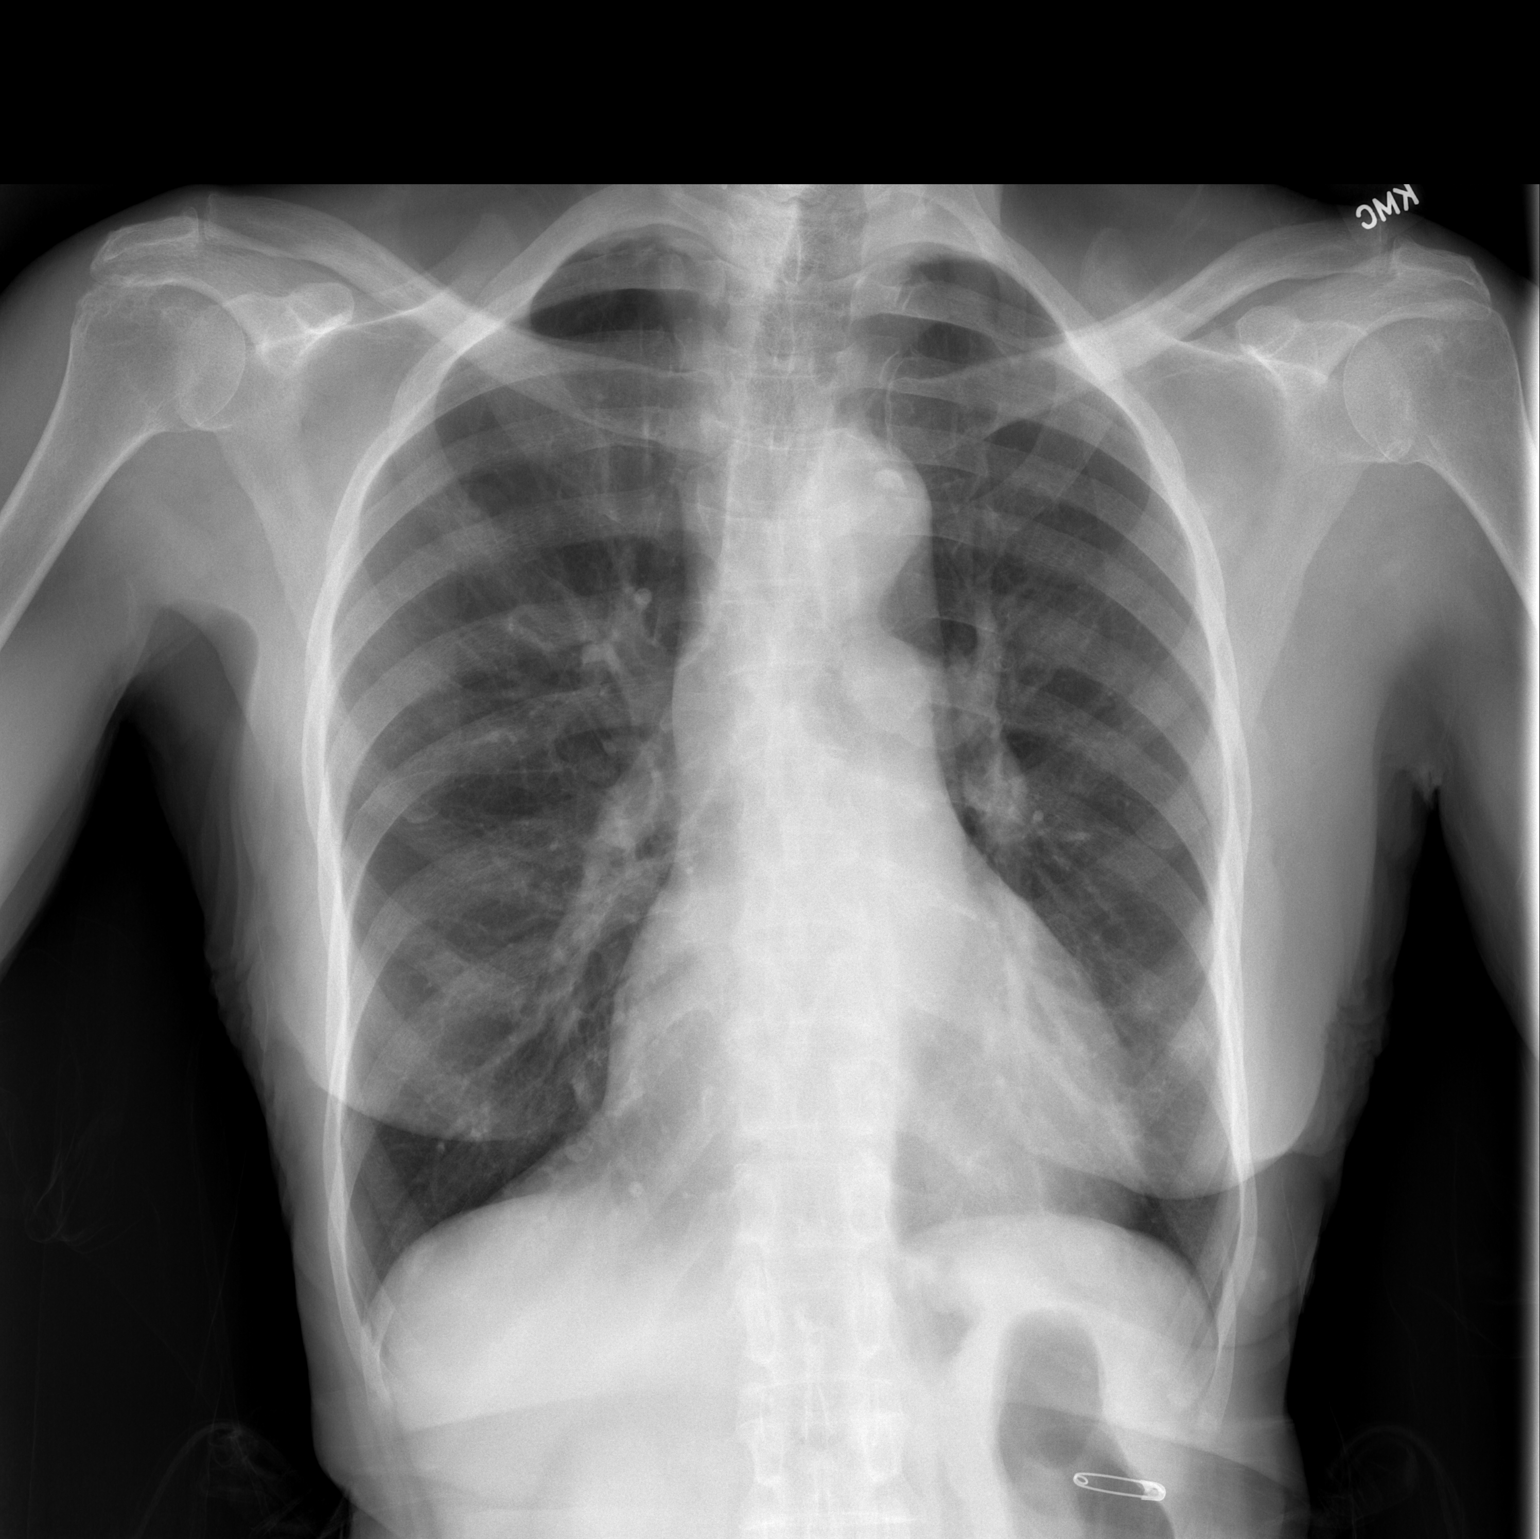

[w chest lat]
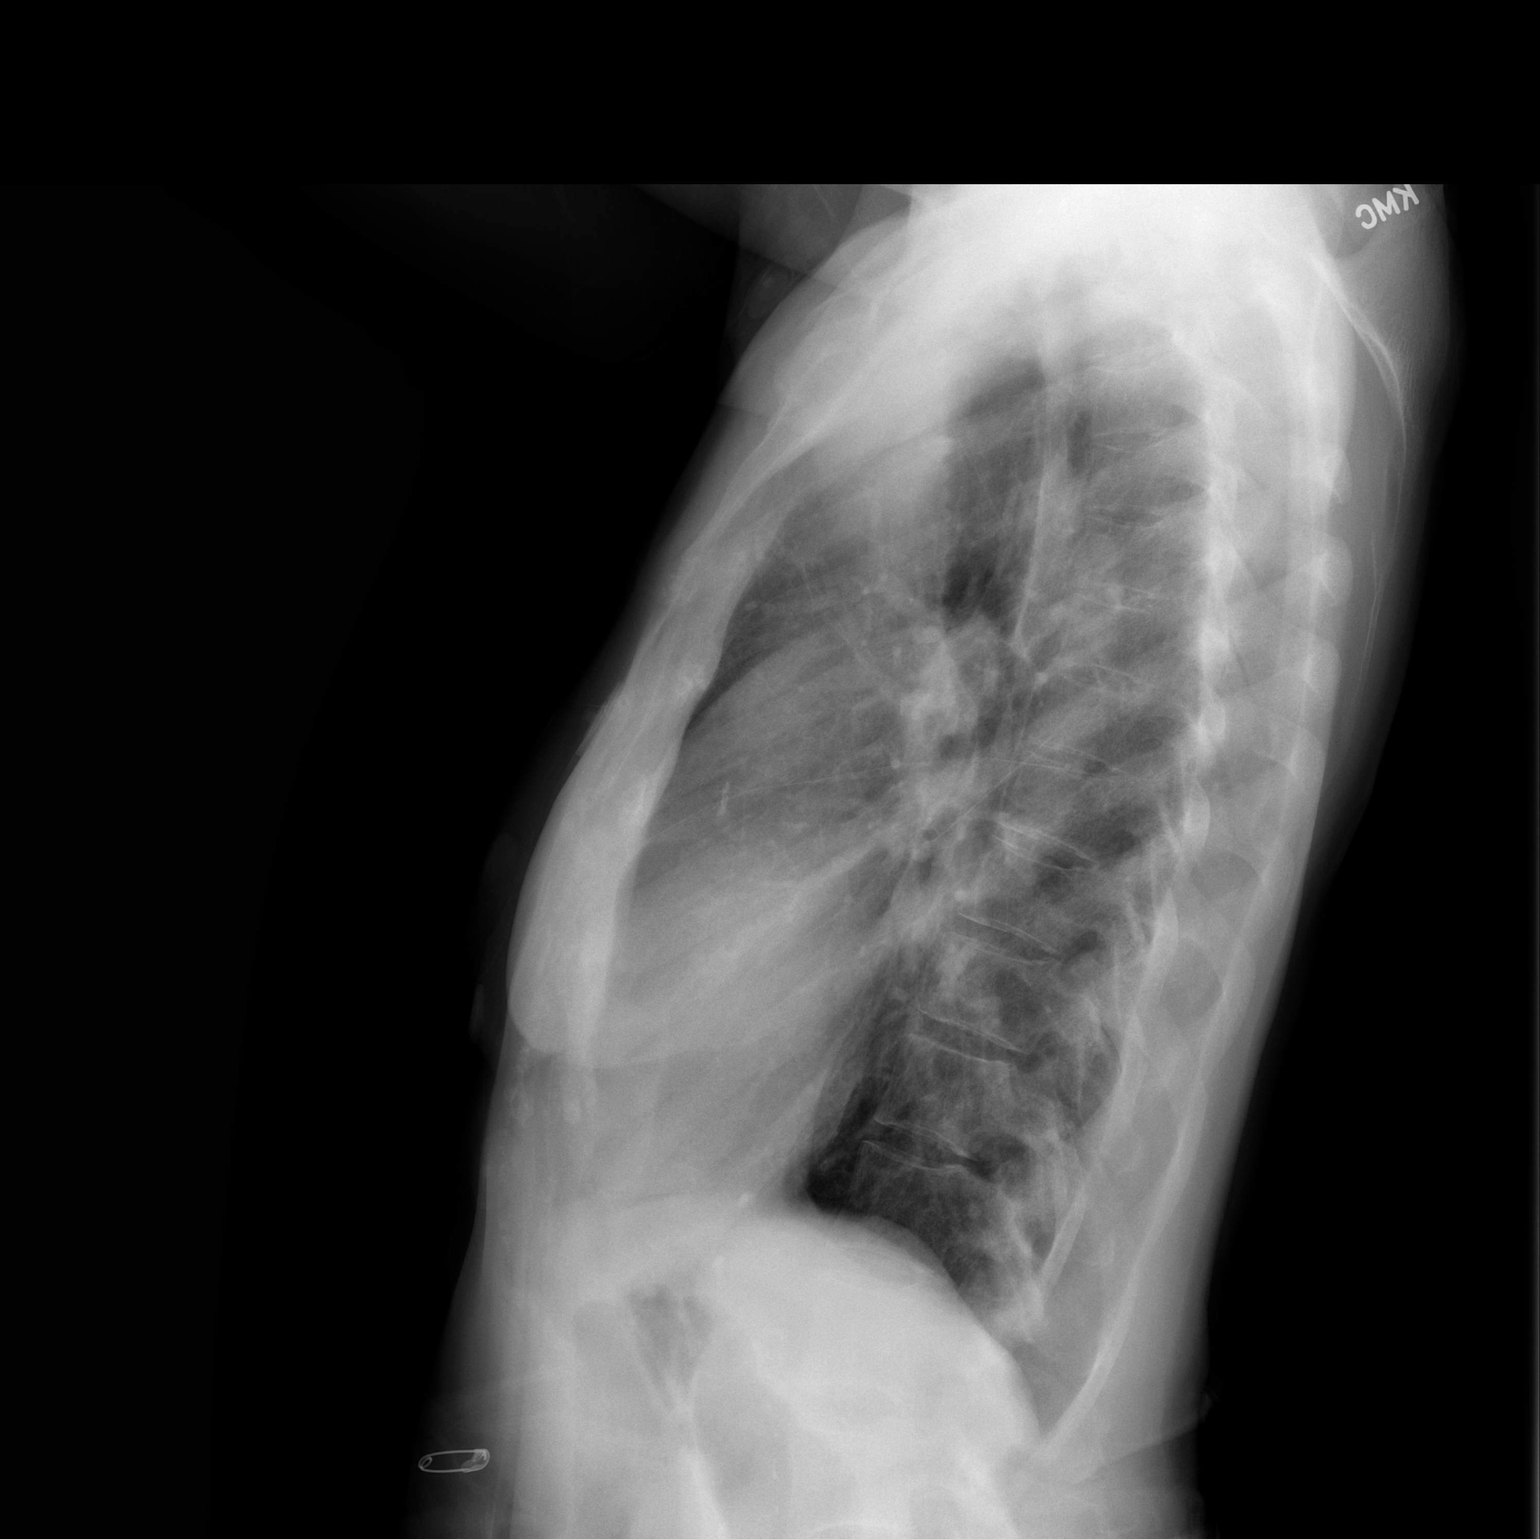

[2 of 2 positions shown; findings below may reference images not displayed]

FINDINGS: Lungs are well expanded, symmetric, and clear. No pneumothorax or
pleural effusion. Cardiac size within normal limits. Pulmonary
vascularity is normal. Osseous structures are age-appropriate. No
acute bone abnormality.
IMPRESSION: No active cardiopulmonary disease.

## 2020-11-01 DIAGNOSIS — N186 End stage renal disease: Secondary | ICD-10-CM | POA: Diagnosis not present

## 2020-11-01 DIAGNOSIS — G8929 Other chronic pain: Secondary | ICD-10-CM | POA: Diagnosis not present

## 2020-11-01 DIAGNOSIS — Z992 Dependence on renal dialysis: Secondary | ICD-10-CM | POA: Diagnosis not present

## 2020-11-01 DIAGNOSIS — M25512 Pain in left shoulder: Secondary | ICD-10-CM | POA: Diagnosis not present

## 2020-11-01 DIAGNOSIS — M7542 Impingement syndrome of left shoulder: Secondary | ICD-10-CM | POA: Diagnosis not present

## 2020-11-02 DIAGNOSIS — N186 End stage renal disease: Secondary | ICD-10-CM | POA: Diagnosis not present

## 2020-11-02 DIAGNOSIS — D509 Iron deficiency anemia, unspecified: Secondary | ICD-10-CM | POA: Diagnosis not present

## 2020-11-02 DIAGNOSIS — N2581 Secondary hyperparathyroidism of renal origin: Secondary | ICD-10-CM | POA: Diagnosis not present

## 2020-11-02 DIAGNOSIS — D631 Anemia in chronic kidney disease: Secondary | ICD-10-CM | POA: Diagnosis not present

## 2020-11-08 DIAGNOSIS — R42 Dizziness and giddiness: Secondary | ICD-10-CM | POA: Diagnosis not present

## 2020-11-19 DIAGNOSIS — Z114 Encounter for screening for human immunodeficiency virus [HIV]: Secondary | ICD-10-CM | POA: Diagnosis not present

## 2020-11-19 DIAGNOSIS — Z1159 Encounter for screening for other viral diseases: Secondary | ICD-10-CM | POA: Diagnosis not present

## 2020-12-01 DIAGNOSIS — Z992 Dependence on renal dialysis: Secondary | ICD-10-CM | POA: Diagnosis not present

## 2020-12-01 DIAGNOSIS — N186 End stage renal disease: Secondary | ICD-10-CM | POA: Diagnosis not present

## 2020-12-03 DIAGNOSIS — N186 End stage renal disease: Secondary | ICD-10-CM | POA: Diagnosis not present

## 2020-12-03 DIAGNOSIS — N2581 Secondary hyperparathyroidism of renal origin: Secondary | ICD-10-CM | POA: Diagnosis not present

## 2020-12-03 DIAGNOSIS — E1122 Type 2 diabetes mellitus with diabetic chronic kidney disease: Secondary | ICD-10-CM | POA: Diagnosis not present

## 2020-12-03 DIAGNOSIS — D631 Anemia in chronic kidney disease: Secondary | ICD-10-CM | POA: Diagnosis not present

## 2020-12-03 DIAGNOSIS — D509 Iron deficiency anemia, unspecified: Secondary | ICD-10-CM | POA: Diagnosis not present

## 2020-12-22 ENCOUNTER — Encounter (HOSPITAL_BASED_OUTPATIENT_CLINIC_OR_DEPARTMENT_OTHER): Payer: Self-pay | Admitting: Emergency Medicine

## 2020-12-22 ENCOUNTER — Other Ambulatory Visit: Payer: Self-pay

## 2020-12-22 ENCOUNTER — Emergency Department (HOSPITAL_BASED_OUTPATIENT_CLINIC_OR_DEPARTMENT_OTHER)
Admission: EM | Admit: 2020-12-22 | Discharge: 2020-12-22 | Disposition: A | Discharge status: Home / Self Care | Payer: PPO | Attending: Emergency Medicine | Admitting: Emergency Medicine

## 2020-12-22 ENCOUNTER — Emergency Department (HOSPITAL_BASED_OUTPATIENT_CLINIC_OR_DEPARTMENT_OTHER): Payer: PPO

## 2020-12-22 DIAGNOSIS — R079 Chest pain, unspecified: Secondary | ICD-10-CM

## 2020-12-22 DIAGNOSIS — Z79899 Other long term (current) drug therapy: Secondary | ICD-10-CM | POA: Diagnosis not present

## 2020-12-22 DIAGNOSIS — E119 Type 2 diabetes mellitus without complications: Secondary | ICD-10-CM | POA: Diagnosis not present

## 2020-12-22 DIAGNOSIS — Z7982 Long term (current) use of aspirin: Secondary | ICD-10-CM | POA: Diagnosis not present

## 2020-12-22 DIAGNOSIS — I10 Essential (primary) hypertension: Secondary | ICD-10-CM | POA: Diagnosis not present

## 2020-12-22 DIAGNOSIS — J45909 Unspecified asthma, uncomplicated: Secondary | ICD-10-CM | POA: Insufficient documentation

## 2020-12-22 DIAGNOSIS — Z7901 Long term (current) use of anticoagulants: Secondary | ICD-10-CM | POA: Insufficient documentation

## 2020-12-22 DIAGNOSIS — R0782 Intercostal pain: Secondary | ICD-10-CM | POA: Diagnosis not present

## 2020-12-22 DIAGNOSIS — I517 Cardiomegaly: Secondary | ICD-10-CM | POA: Diagnosis not present

## 2020-12-22 DIAGNOSIS — R918 Other nonspecific abnormal finding of lung field: Secondary | ICD-10-CM | POA: Diagnosis not present

## 2020-12-22 DIAGNOSIS — R0781 Pleurodynia: Secondary | ICD-10-CM | POA: Diagnosis not present

## 2020-12-22 DIAGNOSIS — R0789 Other chest pain: Secondary | ICD-10-CM | POA: Diagnosis not present

## 2020-12-22 LAB — CBC WITH DIFFERENTIAL/PLATELET
Abs Immature Granulocytes: 0.02 10*3/uL (ref 0.00–0.07)
Basophils Absolute: 0 10*3/uL (ref 0.0–0.1)
Basophils Relative: 1 %
Eosinophils Absolute: 0.1 10*3/uL (ref 0.0–0.5)
Eosinophils Relative: 2 %
HCT: 40.3 % (ref 36.0–46.0)
Hemoglobin: 12 g/dL (ref 12.0–15.0)
Immature Granulocytes: 1 %
Lymphocytes Relative: 31 %
Lymphs Abs: 1.2 10*3/uL (ref 0.7–4.0)
MCH: 27.5 pg (ref 26.0–34.0)
MCHC: 29.8 g/dL — ABNORMAL LOW (ref 30.0–36.0)
MCV: 92.4 fL (ref 80.0–100.0)
Monocytes Absolute: 0.4 10*3/uL (ref 0.1–1.0)
Monocytes Relative: 12 %
Neutro Abs: 2 10*3/uL (ref 1.7–7.7)
Neutrophils Relative %: 53 %
Platelets: 124 10*3/uL — ABNORMAL LOW (ref 150–400)
RBC: 4.36 MIL/uL (ref 3.87–5.11)
RDW: 17.8 % — ABNORMAL HIGH (ref 11.5–15.5)
WBC: 3.7 10*3/uL — ABNORMAL LOW (ref 4.0–10.5)
nRBC: 0 % (ref 0.0–0.2)

## 2020-12-22 LAB — BASIC METABOLIC PANEL
Anion gap: 9 (ref 5–15)
BUN: 38 mg/dL — ABNORMAL HIGH (ref 8–23)
CO2: 29 mmol/L (ref 22–32)
Calcium: 9 mg/dL (ref 8.9–10.3)
Chloride: 102 mmol/L (ref 98–111)
Creatinine, Ser: 5.53 mg/dL — ABNORMAL HIGH (ref 0.44–1.00)
GFR, Estimated: 7 mL/min — ABNORMAL LOW (ref >60)
Glucose, Bld: 100 mg/dL — ABNORMAL HIGH (ref 70–99)
Potassium: 4.6 mmol/L (ref 3.5–5.1)
Sodium: 140 mmol/L (ref 135–145)

## 2020-12-22 LAB — TROPONIN I (HIGH SENSITIVITY)
Troponin I (High Sensitivity): 9 ng/L (ref <18)
Troponin I (High Sensitivity): 9 ng/L (ref <18)

## 2020-12-22 LAB — PROTIME-INR
INR: 1 (ref 0.8–1.2)
Prothrombin Time: 13.4 seconds (ref 11.4–15.2)

## 2020-12-22 MED ORDER — LIDOCAINE 5 % EX PTCH
1.0000 | MEDICATED_PATCH | CUTANEOUS | Status: DC
  Administered 2020-12-22: 1 via TRANSDERMAL
  Filled 2020-12-22: qty 1

## 2020-12-22 NOTE — ED Triage Notes (Signed)
Pt arrives pov, ambulatory to triage, with c/o L rib pain intermittently for "quite awhile" Pt reports pain woke her up this am. Pt denies shob, denies CP, denies dysuria, denies injury

## 2020-12-22 NOTE — ED Provider Notes (Signed)
[facility] EMERGENCY DEPARTMENT Provider Note   CSN: [id number] Arrival date & time: 12/22/20  [id number]     History Chief Complaint  Patient presents with  . Abdominal Pain    Susan Frey is a 87 y.o. female.  She is here with left lower rib pain that began last week and woke her up today.  She said she has had this actually for months and it comes and goes.  She had a ED visit back in February for same with a negative work-up including CT abdomen and pelvis.  No trauma.  No chest pain shortness of breath fevers chills nausea vomiting diarrhea.  She still makes some urine and is having no urinary symptoms.  Dialysis Monday and Friday.  The history is provided by the patient.  Chest Pain Pain location:  L lateral chest Pain quality: aching   Pain severity:  Severe Onset quality:  Gradual Duration:  1 week Timing:  Intermittent Progression:  Worsening Chronicity:  Recurrent Context: movement   Relieved by:  Nothing Worsened by:  Movement Ineffective treatments: rubbing. Associated symptoms: no abdominal pain, no cough, no diaphoresis, no fever, no headache, no nausea, no shortness of breath and no vomiting   Risk factors: diabetes mellitus        Past Medical History:  Diagnosis Date  . Anemia   . Asthma   . Cataract   . Diabetes mellitus without complication ([place])   . Hyperlipidemia   . Hypertension     Patient Active Problem List   Diagnosis Date Noted  . Asthma 08/27/2016  . Acute pulmonary embolism ([place]) 08/27/2016    Past Surgical History:  Procedure Laterality Date  . EYE SURGERY    . NECK SURGERY    . SHOULDER SURGERY     patient states both right and left shoulders     OB History   No obstetric history on file.     Family History  Problem Relation Age of Onset  . Heart disease Mother   . Hypertension Sister   . Hypertension Brother   . Hypertension Daughter   . Cancer Maternal Grandmother     Social History   Tobacco Use  .  Smoking status: Never Smoker  . Smokeless tobacco: Never Used  Vaping Use  . Vaping Use: Never used  Substance Use Topics  . Alcohol use: No  . Drug use: No    Home Medications Prior to Admission medications   Medication Sig Start Date End Date Taking? Authorizing Provider  albuterol (VENTOLIN HFA) 108 (90 Base) MCG/ACT inhaler Inhale into the lungs. Patient not taking: Reported on 04/25/2020 08/02/16   [provider]  amLODipine (NORVASC) 5 MG tablet Take 5 mg by mouth daily. 02/02/20   [provider]  aspirin 81 MG EC tablet Take by mouth. 02/22/17   [provider]  calcium carbonate (TUMS EX) 750 MG chewable tablet Chew by mouth.    [provider]  diclofenac Sodium (VOLTAREN) 1 % GEL APPLY EXTERNALLY TO THE AFFECTED AREA THREE TIMES DAILY AS NEEDED 02/03/17   [provider]  omeprazole (PRILOSEC) 40 MG capsule Take by mouth. 03/15/20   [provider]  rosuvastatin (CRESTOR) 10 MG tablet Take 10 mg by mouth at bedtime. 03/31/20   [provider]  traMADol (ULTRAM) 50 MG tablet Take 50 mg by mouth 3 (three) times daily as needed. 04/16/20   [provider]  traMADol (ULTRAM-ER) 200 MG 24 hr tablet Take 200  mg by mouth daily. Patient states she takes as needed. Patient not taking: Reported on 04/25/2020    [provider]  warfarin (COUMADIN) 5 MG tablet Take 5 mg by mouth daily. Patient states she takes at night Patient not taking: Reported on 04/25/2020    [provider]    Allergies    Pregabalin, Atorvastatin, and Gabapentin  Review of Systems   Review of Systems  Constitutional: Negative for diaphoresis and fever.  HENT: Negative for sore throat.   Eyes: Negative for visual disturbance.  Respiratory: Negative for cough and shortness of breath.   Cardiovascular: Positive for chest pain.  Gastrointestinal: Negative for abdominal pain, nausea and vomiting.  Genitourinary: Negative for  dysuria.  Musculoskeletal: Negative for neck pain.  Skin: Negative for rash.  Neurological: Negative for headaches.    Physical Exam Updated Vital Signs BP (!) 160/82 (BP Location: Right Arm)   Pulse 75   Temp 98.2 F (36.8 C) (Oral)   Resp 16   Ht '5\' 3"'$  (1.6 m)   Wt 54.4 kg   SpO2 98%   BMI 21.26 kg/m   Physical Exam Vitals and nursing note reviewed.  Constitutional:      General: She is not in acute distress.    Appearance: Normal appearance. She is well-developed.  HENT:     Head: Normocephalic and atraumatic.  Eyes:     Conjunctiva/sclera: Conjunctivae normal.  Cardiovascular:     Rate and Rhythm: Normal rate and regular rhythm.     Heart sounds: No murmur heard.   Pulmonary:     Effort: Pulmonary effort is normal. No respiratory distress.     Breath sounds: Normal breath sounds.  Chest:    Abdominal:     Palpations: Abdomen is soft.     Tenderness: There is no abdominal tenderness. There is no guarding or rebound.  Musculoskeletal:        General: No deformity or signs of injury. Normal range of motion.     Cervical back: Neck supple.     Right lower leg: No edema.     Left lower leg: No edema.  Skin:    General: Skin is warm and dry.  Neurological:     General: No focal deficit present.     Mental Status: She is alert.     ED Results / Procedures / Treatments   Labs (all labs ordered are listed, but only abnormal results are displayed) Labs Reviewed  BASIC METABOLIC PANEL - Abnormal; Notable for the following components:      Result Value   Glucose, Bld 100 (*)    BUN 38 (*)    Creatinine, Ser 5.53 (*)    GFR, Estimated 7 (*)    All other components within normal limits  CBC WITH DIFFERENTIAL/PLATELET - Abnormal; Notable for the following components:   WBC 3.7 (*)    MCHC 29.8 (*)    RDW 17.8 (*)    Platelets 124 (*)    All other components within normal limits  PROTIME-INR  TROPONIN I (HIGH SENSITIVITY)  TROPONIN I (HIGH SENSITIVITY)     EKG EKG Interpretation  Date/Time:  Saturday Dec 22 2020 09:45:39 EDT Ventricular Rate:  75 PR Interval:  229 QRS Duration: 84 QT Interval:  400 QTC Calculation: 447 R Axis:   -38 Text Interpretation: Sinus rhythm Atrial premature complexes Prolonged PR interval Left ventricular hypertrophy Inferior infarct, old No significant change since prior 9/21 Confirmed by Aletta Edouard [id number]) on 12/22/2020 9:48:57 AM  Radiology DG Ribs Unilateral W/Chest Left  Result Date: 12/22/2020 CLINICAL DATA:  Lateral lower rib pain. EXAM: LEFT RIBS AND CHEST - 3+ VIEW COMPARISON:  Chest x-ray September 18, 2020 FINDINGS: Stable cardiomegaly. The hila and mediastinum are normal. No pneumothorax. Hyperinflation of the lungs. A nipple shadow is seen on the right. No suspicious nodules or masses. No focal infiltrates. No rib fractures.  No rib abnormalities. IMPRESSION: 1. Hyperinflation of the lungs consistent with COPD or emphysema. 2. Stable cardiomegaly. 3. No rib abnormalities to explain the patient's pain. Electronically Signed   By: Dorise Bullion III M.D   On: 12/22/2020 10:19    Procedures Procedures   Medications Ordered in ED Medications  lidocaine (LIDODERM) 5 % 1 patch (1 patch Transdermal Patch Applied 12/22/20 1039)    ED Course  I have reviewed the triage vital signs and the nursing notes.  Pertinent labs & imaging results that were available during my care of the patient were reviewed by me and considered in my medical decision making (see chart for details).  Clinical Course as of 12/22/20 1743  Sat Dec 22, 2020  1201 Patient states her pain is a little bit better after the Lidoderm patch.  Awaiting delta troponin. [MB]    Clinical Course User Index [MB] Hayden Rasmussen, MD   MDM Rules/Calculators/A&P                         This patient complains of left lateral chest wall pain; this involves an extensive number of treatment Options and is a complaint that carries with  it a high risk of complications and Morbidity. The differential includes rib fracture, contusion, musculoskeletal, GERD, ACS, pneumonia, PE, pneumothorax, shingles  I ordered, reviewed and interpreted labs, which included CBC with a slightly depressed white count new from priors and low platelets similar to prior, chemistries consistent with her end-stage renal disease and elevated BUN and creatinine, troponins flat I ordered medication Lidoderm transdermal with improvement in her pain I ordered imaging studies which included chest x-ray and left rib series and I independently    visualized and interpreted imaging which showed no acute findings Previous records obtained and reviewed in epic including prior ED visit 3 months ago for similar complaint with negative CT chest x-ray troponins  After the interventions stated above, I reevaluated the patient and found patient's pain to be improved.  Blood pressure elevated otherwise hemodynamically stable.  She appears in no distress.  Discussed results with her and recommended close follow-up with PCP for further evaluation.  Return instructions discussed   Final Clinical Impression(s) / ED Diagnoses Final diagnoses:  Nonspecific chest pain    Rx / DC Orders ED Discharge Orders    None       Hayden Rasmussen, MD 12/22/20 1746

## 2020-12-22 NOTE — ED Notes (Signed)
Provided EKG to Dr. Melina Copa at [phone].

## 2020-12-22 NOTE — Discharge Instructions (Addendum)
You were seen in the emergency department for some left-sided chest and rib pain.  You had blood work EKG and a chest and rib x-ray that did not show an obvious explanation for your symptoms.  Your pain was improved with the Lidoderm patch and it can stay on for 12 hours.  Please contact your primary care doctor for follow-up.

## 2020-12-25 ENCOUNTER — Other Ambulatory Visit: Payer: Self-pay

## 2020-12-25 DIAGNOSIS — I739 Peripheral vascular disease, unspecified: Secondary | ICD-10-CM | POA: Diagnosis not present

## 2020-12-25 DIAGNOSIS — N2581 Secondary hyperparathyroidism of renal origin: Secondary | ICD-10-CM | POA: Diagnosis not present

## 2020-12-25 DIAGNOSIS — M542 Cervicalgia: Secondary | ICD-10-CM | POA: Diagnosis not present

## 2020-12-25 DIAGNOSIS — E1122 Type 2 diabetes mellitus with diabetic chronic kidney disease: Secondary | ICD-10-CM | POA: Diagnosis not present

## 2020-12-25 DIAGNOSIS — E1151 Type 2 diabetes mellitus with diabetic peripheral angiopathy without gangrene: Secondary | ICD-10-CM | POA: Diagnosis not present

## 2020-12-25 DIAGNOSIS — I6523 Occlusion and stenosis of bilateral carotid arteries: Secondary | ICD-10-CM | POA: Diagnosis not present

## 2020-12-25 DIAGNOSIS — I770 Arteriovenous fistula, acquired: Secondary | ICD-10-CM | POA: Diagnosis not present

## 2020-12-25 DIAGNOSIS — D693 Immune thrombocytopenic purpura: Secondary | ICD-10-CM | POA: Diagnosis not present

## 2020-12-25 DIAGNOSIS — N185 Chronic kidney disease, stage 5: Secondary | ICD-10-CM | POA: Diagnosis not present

## 2020-12-25 DIAGNOSIS — I120 Hypertensive chronic kidney disease with stage 5 chronic kidney disease or end stage renal disease: Secondary | ICD-10-CM | POA: Diagnosis not present

## 2020-12-25 NOTE — Patient Outreach (Signed)
Aging Gracefully Program  12/25/2020  Susan Frey May 02, 1933 [id number]  [facility] Evaluation Interviewer made contact with patient. Aging Gracefully survey scheduled for 01/02/21.  [place] Management Assistant [phone]

## 2021-01-01 DIAGNOSIS — N186 End stage renal disease: Secondary | ICD-10-CM | POA: Diagnosis not present

## 2021-01-01 DIAGNOSIS — Z992 Dependence on renal dialysis: Secondary | ICD-10-CM | POA: Diagnosis not present

## 2021-01-04 DIAGNOSIS — D509 Iron deficiency anemia, unspecified: Secondary | ICD-10-CM | POA: Diagnosis not present

## 2021-01-04 DIAGNOSIS — N186 End stage renal disease: Secondary | ICD-10-CM | POA: Diagnosis not present

## 2021-01-04 DIAGNOSIS — E1122 Type 2 diabetes mellitus with diabetic chronic kidney disease: Secondary | ICD-10-CM | POA: Diagnosis not present

## 2021-01-04 DIAGNOSIS — N2581 Secondary hyperparathyroidism of renal origin: Secondary | ICD-10-CM | POA: Diagnosis not present

## 2021-01-04 DIAGNOSIS — D631 Anemia in chronic kidney disease: Secondary | ICD-10-CM | POA: Diagnosis not present

## 2021-01-08 ENCOUNTER — Other Ambulatory Visit: Payer: Self-pay

## 2021-01-08 NOTE — Patient Outreach (Signed)
Aging Gracefully Program  01/08/2021  FALON KENDAL [birth year] [id number]   [facility] Evaluation Interviewer made contact with patient. Aging Gracefully final survey completed.   Interviewer will send referral to Sharol Given with [facility] for follow up.  [place] Management Assistant [phone]

## 2021-01-10 DIAGNOSIS — Z7982 Long term (current) use of aspirin: Secondary | ICD-10-CM | POA: Diagnosis not present

## 2021-01-10 DIAGNOSIS — I120 Hypertensive chronic kidney disease with stage 5 chronic kidney disease or end stage renal disease: Secondary | ICD-10-CM | POA: Diagnosis not present

## 2021-01-10 DIAGNOSIS — I517 Cardiomegaly: Secondary | ICD-10-CM | POA: Diagnosis not present

## 2021-01-10 DIAGNOSIS — E785 Hyperlipidemia, unspecified: Secondary | ICD-10-CM | POA: Diagnosis not present

## 2021-01-10 DIAGNOSIS — R911 Solitary pulmonary nodule: Secondary | ICD-10-CM | POA: Diagnosis not present

## 2021-01-10 DIAGNOSIS — I440 Atrioventricular block, first degree: Secondary | ICD-10-CM | POA: Diagnosis not present

## 2021-01-10 DIAGNOSIS — I6529 Occlusion and stenosis of unspecified carotid artery: Secondary | ICD-10-CM | POA: Diagnosis not present

## 2021-01-10 DIAGNOSIS — R0602 Shortness of breath: Secondary | ICD-10-CM | POA: Diagnosis not present

## 2021-01-10 DIAGNOSIS — E1151 Type 2 diabetes mellitus with diabetic peripheral angiopathy without gangrene: Secondary | ICD-10-CM | POA: Diagnosis not present

## 2021-01-10 DIAGNOSIS — R0789 Other chest pain: Secondary | ICD-10-CM | POA: Diagnosis not present

## 2021-01-10 DIAGNOSIS — I081 Rheumatic disorders of both mitral and tricuspid valves: Secondary | ICD-10-CM | POA: Diagnosis not present

## 2021-01-10 DIAGNOSIS — J9811 Atelectasis: Secondary | ICD-10-CM | POA: Diagnosis not present

## 2021-01-10 DIAGNOSIS — Z79891 Long term (current) use of opiate analgesic: Secondary | ICD-10-CM | POA: Diagnosis not present

## 2021-01-10 DIAGNOSIS — I491 Atrial premature depolarization: Secondary | ICD-10-CM | POA: Diagnosis not present

## 2021-01-10 DIAGNOSIS — I6523 Occlusion and stenosis of bilateral carotid arteries: Secondary | ICD-10-CM | POA: Diagnosis not present

## 2021-01-10 DIAGNOSIS — Z20822 Contact with and (suspected) exposure to covid-19: Secondary | ICD-10-CM | POA: Diagnosis not present

## 2021-01-10 DIAGNOSIS — Z992 Dependence on renal dialysis: Secondary | ICD-10-CM | POA: Diagnosis not present

## 2021-01-10 DIAGNOSIS — Z79899 Other long term (current) drug therapy: Secondary | ICD-10-CM | POA: Diagnosis not present

## 2021-01-10 DIAGNOSIS — I700 Atherosclerosis of aorta: Secondary | ICD-10-CM | POA: Diagnosis not present

## 2021-01-10 DIAGNOSIS — K219 Gastro-esophageal reflux disease without esophagitis: Secondary | ICD-10-CM | POA: Diagnosis not present

## 2021-01-10 DIAGNOSIS — R918 Other nonspecific abnormal finding of lung field: Secondary | ICD-10-CM | POA: Diagnosis not present

## 2021-01-10 DIAGNOSIS — R7989 Other specified abnormal findings of blood chemistry: Secondary | ICD-10-CM | POA: Diagnosis not present

## 2021-01-10 DIAGNOSIS — N2581 Secondary hyperparathyroidism of renal origin: Secondary | ICD-10-CM | POA: Diagnosis not present

## 2021-01-10 DIAGNOSIS — N186 End stage renal disease: Secondary | ICD-10-CM | POA: Diagnosis not present

## 2021-01-10 DIAGNOSIS — E1122 Type 2 diabetes mellitus with diabetic chronic kidney disease: Secondary | ICD-10-CM | POA: Diagnosis not present

## 2021-01-10 DIAGNOSIS — R079 Chest pain, unspecified: Secondary | ICD-10-CM | POA: Diagnosis not present

## 2021-01-10 DIAGNOSIS — M542 Cervicalgia: Secondary | ICD-10-CM | POA: Diagnosis not present

## 2021-01-10 DIAGNOSIS — R519 Headache, unspecified: Secondary | ICD-10-CM | POA: Diagnosis not present

## 2021-01-11 DIAGNOSIS — N2581 Secondary hyperparathyroidism of renal origin: Secondary | ICD-10-CM | POA: Diagnosis not present

## 2021-01-11 DIAGNOSIS — J9811 Atelectasis: Secondary | ICD-10-CM | POA: Diagnosis not present

## 2021-01-11 DIAGNOSIS — E1122 Type 2 diabetes mellitus with diabetic chronic kidney disease: Secondary | ICD-10-CM | POA: Diagnosis not present

## 2021-01-11 DIAGNOSIS — K219 Gastro-esophageal reflux disease without esophagitis: Secondary | ICD-10-CM | POA: Diagnosis not present

## 2021-01-11 DIAGNOSIS — I6529 Occlusion and stenosis of unspecified carotid artery: Secondary | ICD-10-CM | POA: Diagnosis not present

## 2021-01-11 DIAGNOSIS — I440 Atrioventricular block, first degree: Secondary | ICD-10-CM | POA: Diagnosis not present

## 2021-01-11 DIAGNOSIS — R0789 Other chest pain: Secondary | ICD-10-CM | POA: Diagnosis not present

## 2021-01-11 DIAGNOSIS — Z992 Dependence on renal dialysis: Secondary | ICD-10-CM | POA: Diagnosis not present

## 2021-01-11 DIAGNOSIS — E785 Hyperlipidemia, unspecified: Secondary | ICD-10-CM | POA: Diagnosis not present

## 2021-01-11 DIAGNOSIS — E1151 Type 2 diabetes mellitus with diabetic peripheral angiopathy without gangrene: Secondary | ICD-10-CM | POA: Diagnosis not present

## 2021-01-11 DIAGNOSIS — R079 Chest pain, unspecified: Secondary | ICD-10-CM | POA: Diagnosis not present

## 2021-01-11 DIAGNOSIS — I4510 Unspecified right bundle-branch block: Secondary | ICD-10-CM | POA: Diagnosis not present

## 2021-01-11 DIAGNOSIS — I120 Hypertensive chronic kidney disease with stage 5 chronic kidney disease or end stage renal disease: Secondary | ICD-10-CM | POA: Diagnosis not present

## 2021-01-11 DIAGNOSIS — N186 End stage renal disease: Secondary | ICD-10-CM | POA: Diagnosis not present

## 2021-01-12 DIAGNOSIS — R0789 Other chest pain: Secondary | ICD-10-CM | POA: Diagnosis not present

## 2021-01-12 DIAGNOSIS — E1151 Type 2 diabetes mellitus with diabetic peripheral angiopathy without gangrene: Secondary | ICD-10-CM | POA: Diagnosis not present

## 2021-01-12 DIAGNOSIS — Z992 Dependence on renal dialysis: Secondary | ICD-10-CM | POA: Diagnosis not present

## 2021-01-12 DIAGNOSIS — I120 Hypertensive chronic kidney disease with stage 5 chronic kidney disease or end stage renal disease: Secondary | ICD-10-CM | POA: Diagnosis not present

## 2021-01-12 DIAGNOSIS — E785 Hyperlipidemia, unspecified: Secondary | ICD-10-CM | POA: Diagnosis not present

## 2021-01-12 DIAGNOSIS — N2581 Secondary hyperparathyroidism of renal origin: Secondary | ICD-10-CM | POA: Diagnosis not present

## 2021-01-12 DIAGNOSIS — I6529 Occlusion and stenosis of unspecified carotid artery: Secondary | ICD-10-CM | POA: Diagnosis not present

## 2021-01-12 DIAGNOSIS — N186 End stage renal disease: Secondary | ICD-10-CM | POA: Diagnosis not present

## 2021-01-12 DIAGNOSIS — K219 Gastro-esophageal reflux disease without esophagitis: Secondary | ICD-10-CM | POA: Diagnosis not present

## 2021-01-12 DIAGNOSIS — E1122 Type 2 diabetes mellitus with diabetic chronic kidney disease: Secondary | ICD-10-CM | POA: Diagnosis not present

## 2021-01-22 DIAGNOSIS — E7800 Pure hypercholesterolemia, unspecified: Secondary | ICD-10-CM | POA: Diagnosis not present

## 2021-01-22 DIAGNOSIS — I120 Hypertensive chronic kidney disease with stage 5 chronic kidney disease or end stage renal disease: Secondary | ICD-10-CM | POA: Diagnosis not present

## 2021-01-22 DIAGNOSIS — N186 End stage renal disease: Secondary | ICD-10-CM | POA: Diagnosis not present

## 2021-01-22 DIAGNOSIS — Z992 Dependence on renal dialysis: Secondary | ICD-10-CM | POA: Diagnosis not present

## 2021-01-22 DIAGNOSIS — R079 Chest pain, unspecified: Secondary | ICD-10-CM | POA: Diagnosis not present

## 2021-01-31 DIAGNOSIS — Z992 Dependence on renal dialysis: Secondary | ICD-10-CM | POA: Diagnosis not present

## 2021-01-31 DIAGNOSIS — N186 End stage renal disease: Secondary | ICD-10-CM | POA: Diagnosis not present

## 2021-02-01 DIAGNOSIS — N186 End stage renal disease: Secondary | ICD-10-CM | POA: Diagnosis not present

## 2021-02-01 DIAGNOSIS — N2581 Secondary hyperparathyroidism of renal origin: Secondary | ICD-10-CM | POA: Diagnosis not present

## 2021-02-01 DIAGNOSIS — D509 Iron deficiency anemia, unspecified: Secondary | ICD-10-CM | POA: Diagnosis not present

## 2021-02-01 DIAGNOSIS — D631 Anemia in chronic kidney disease: Secondary | ICD-10-CM | POA: Diagnosis not present

## 2021-02-04 DIAGNOSIS — E1122 Type 2 diabetes mellitus with diabetic chronic kidney disease: Secondary | ICD-10-CM | POA: Diagnosis not present

## 2021-02-15 DIAGNOSIS — R079 Chest pain, unspecified: Secondary | ICD-10-CM | POA: Diagnosis not present

## 2021-02-20 DIAGNOSIS — I70203 Unspecified atherosclerosis of native arteries of extremities, bilateral legs: Secondary | ICD-10-CM | POA: Diagnosis not present

## 2021-02-20 DIAGNOSIS — M5412 Radiculopathy, cervical region: Secondary | ICD-10-CM | POA: Diagnosis not present

## 2021-02-20 DIAGNOSIS — Z992 Dependence on renal dialysis: Secondary | ICD-10-CM | POA: Diagnosis not present

## 2021-02-20 DIAGNOSIS — M47812 Spondylosis without myelopathy or radiculopathy, cervical region: Secondary | ICD-10-CM | POA: Diagnosis not present

## 2021-02-20 DIAGNOSIS — M25512 Pain in left shoulder: Secondary | ICD-10-CM | POA: Diagnosis not present

## 2021-02-20 DIAGNOSIS — M85859 Other specified disorders of bone density and structure, unspecified thigh: Secondary | ICD-10-CM | POA: Diagnosis not present

## 2021-02-20 DIAGNOSIS — E7800 Pure hypercholesterolemia, unspecified: Secondary | ICD-10-CM | POA: Diagnosis not present

## 2021-02-20 DIAGNOSIS — I6522 Occlusion and stenosis of left carotid artery: Secondary | ICD-10-CM | POA: Diagnosis not present

## 2021-02-20 DIAGNOSIS — D693 Immune thrombocytopenic purpura: Secondary | ICD-10-CM | POA: Diagnosis not present

## 2021-02-20 DIAGNOSIS — E1122 Type 2 diabetes mellitus with diabetic chronic kidney disease: Secondary | ICD-10-CM | POA: Diagnosis not present

## 2021-02-20 DIAGNOSIS — I120 Hypertensive chronic kidney disease with stage 5 chronic kidney disease or end stage renal disease: Secondary | ICD-10-CM | POA: Diagnosis not present

## 2021-02-20 DIAGNOSIS — D631 Anemia in chronic kidney disease: Secondary | ICD-10-CM | POA: Diagnosis not present

## 2021-02-20 DIAGNOSIS — N186 End stage renal disease: Secondary | ICD-10-CM | POA: Diagnosis not present

## 2021-03-03 DIAGNOSIS — N186 End stage renal disease: Secondary | ICD-10-CM | POA: Diagnosis not present

## 2021-03-03 DIAGNOSIS — Z992 Dependence on renal dialysis: Secondary | ICD-10-CM | POA: Diagnosis not present

## 2021-03-04 DIAGNOSIS — N186 End stage renal disease: Secondary | ICD-10-CM | POA: Diagnosis not present

## 2021-03-04 DIAGNOSIS — E1122 Type 2 diabetes mellitus with diabetic chronic kidney disease: Secondary | ICD-10-CM | POA: Diagnosis not present

## 2021-03-04 DIAGNOSIS — D631 Anemia in chronic kidney disease: Secondary | ICD-10-CM | POA: Diagnosis not present

## 2021-03-04 DIAGNOSIS — N2581 Secondary hyperparathyroidism of renal origin: Secondary | ICD-10-CM | POA: Diagnosis not present

## 2021-03-04 DIAGNOSIS — D509 Iron deficiency anemia, unspecified: Secondary | ICD-10-CM | POA: Diagnosis not present

## 2021-03-07 DIAGNOSIS — M4802 Spinal stenosis, cervical region: Secondary | ICD-10-CM | POA: Diagnosis not present

## 2021-03-07 DIAGNOSIS — M47812 Spondylosis without myelopathy or radiculopathy, cervical region: Secondary | ICD-10-CM | POA: Diagnosis not present

## 2021-04-03 DIAGNOSIS — N186 End stage renal disease: Secondary | ICD-10-CM | POA: Diagnosis not present

## 2021-04-03 DIAGNOSIS — Z992 Dependence on renal dialysis: Secondary | ICD-10-CM | POA: Diagnosis not present

## 2021-04-04 DIAGNOSIS — M7542 Impingement syndrome of left shoulder: Secondary | ICD-10-CM | POA: Diagnosis not present

## 2021-04-05 DIAGNOSIS — D631 Anemia in chronic kidney disease: Secondary | ICD-10-CM | POA: Diagnosis not present

## 2021-04-05 DIAGNOSIS — D509 Iron deficiency anemia, unspecified: Secondary | ICD-10-CM | POA: Diagnosis not present

## 2021-04-05 DIAGNOSIS — N186 End stage renal disease: Secondary | ICD-10-CM | POA: Diagnosis not present

## 2021-04-05 DIAGNOSIS — N2581 Secondary hyperparathyroidism of renal origin: Secondary | ICD-10-CM | POA: Diagnosis not present

## 2021-04-07 DIAGNOSIS — M75122 Complete rotator cuff tear or rupture of left shoulder, not specified as traumatic: Secondary | ICD-10-CM | POA: Diagnosis not present

## 2021-04-07 DIAGNOSIS — M7542 Impingement syndrome of left shoulder: Secondary | ICD-10-CM | POA: Diagnosis not present

## 2021-04-08 DIAGNOSIS — E1122 Type 2 diabetes mellitus with diabetic chronic kidney disease: Secondary | ICD-10-CM | POA: Diagnosis not present

## 2021-04-09 DIAGNOSIS — M75122 Complete rotator cuff tear or rupture of left shoulder, not specified as traumatic: Secondary | ICD-10-CM | POA: Diagnosis not present

## 2021-04-12 DIAGNOSIS — U071 COVID-19: Secondary | ICD-10-CM | POA: Diagnosis not present

## 2021-04-25 DIAGNOSIS — N186 End stage renal disease: Secondary | ICD-10-CM | POA: Diagnosis not present

## 2021-04-25 DIAGNOSIS — Z23 Encounter for immunization: Secondary | ICD-10-CM | POA: Diagnosis not present

## 2021-04-25 DIAGNOSIS — I6529 Occlusion and stenosis of unspecified carotid artery: Secondary | ICD-10-CM | POA: Diagnosis not present

## 2021-04-25 DIAGNOSIS — Z992 Dependence on renal dialysis: Secondary | ICD-10-CM | POA: Diagnosis not present

## 2021-04-25 DIAGNOSIS — R0781 Pleurodynia: Secondary | ICD-10-CM | POA: Diagnosis not present

## 2021-04-25 DIAGNOSIS — D631 Anemia in chronic kidney disease: Secondary | ICD-10-CM | POA: Diagnosis not present

## 2021-04-25 DIAGNOSIS — I120 Hypertensive chronic kidney disease with stage 5 chronic kidney disease or end stage renal disease: Secondary | ICD-10-CM | POA: Diagnosis not present

## 2021-04-25 DIAGNOSIS — G8929 Other chronic pain: Secondary | ICD-10-CM | POA: Diagnosis not present

## 2021-04-25 DIAGNOSIS — E7800 Pure hypercholesterolemia, unspecified: Secondary | ICD-10-CM | POA: Diagnosis not present

## 2021-04-25 DIAGNOSIS — I70203 Unspecified atherosclerosis of native arteries of extremities, bilateral legs: Secondary | ICD-10-CM | POA: Diagnosis not present

## 2021-04-25 DIAGNOSIS — M25512 Pain in left shoulder: Secondary | ICD-10-CM | POA: Diagnosis not present

## 2021-05-03 DIAGNOSIS — N186 End stage renal disease: Secondary | ICD-10-CM | POA: Diagnosis not present

## 2021-05-03 DIAGNOSIS — Z992 Dependence on renal dialysis: Secondary | ICD-10-CM | POA: Diagnosis not present

## 2021-05-06 DIAGNOSIS — N186 End stage renal disease: Secondary | ICD-10-CM | POA: Diagnosis not present

## 2021-05-06 DIAGNOSIS — D631 Anemia in chronic kidney disease: Secondary | ICD-10-CM | POA: Diagnosis not present

## 2021-05-06 DIAGNOSIS — N2581 Secondary hyperparathyroidism of renal origin: Secondary | ICD-10-CM | POA: Diagnosis not present

## 2021-05-06 DIAGNOSIS — D509 Iron deficiency anemia, unspecified: Secondary | ICD-10-CM | POA: Diagnosis not present

## 2021-05-14 DIAGNOSIS — R0781 Pleurodynia: Secondary | ICD-10-CM | POA: Diagnosis not present

## 2021-06-03 DIAGNOSIS — Z992 Dependence on renal dialysis: Secondary | ICD-10-CM | POA: Diagnosis not present

## 2021-06-03 DIAGNOSIS — N186 End stage renal disease: Secondary | ICD-10-CM | POA: Diagnosis not present

## 2021-06-07 DIAGNOSIS — N186 End stage renal disease: Secondary | ICD-10-CM | POA: Diagnosis not present

## 2021-06-07 DIAGNOSIS — D631 Anemia in chronic kidney disease: Secondary | ICD-10-CM | POA: Diagnosis not present

## 2021-06-07 DIAGNOSIS — N2581 Secondary hyperparathyroidism of renal origin: Secondary | ICD-10-CM | POA: Diagnosis not present

## 2021-06-07 DIAGNOSIS — D509 Iron deficiency anemia, unspecified: Secondary | ICD-10-CM | POA: Diagnosis not present

## 2021-06-11 DIAGNOSIS — G8929 Other chronic pain: Secondary | ICD-10-CM | POA: Diagnosis not present

## 2021-06-11 DIAGNOSIS — R0789 Other chest pain: Secondary | ICD-10-CM | POA: Diagnosis not present

## 2021-06-14 DIAGNOSIS — M19012 Primary osteoarthritis, left shoulder: Secondary | ICD-10-CM | POA: Diagnosis not present

## 2021-06-14 DIAGNOSIS — Z992 Dependence on renal dialysis: Secondary | ICD-10-CM | POA: Diagnosis not present

## 2021-06-14 DIAGNOSIS — M75122 Complete rotator cuff tear or rupture of left shoulder, not specified as traumatic: Secondary | ICD-10-CM | POA: Diagnosis not present

## 2021-06-14 DIAGNOSIS — M25712 Osteophyte, left shoulder: Secondary | ICD-10-CM | POA: Diagnosis not present

## 2021-06-14 DIAGNOSIS — N186 End stage renal disease: Secondary | ICD-10-CM | POA: Diagnosis not present

## 2021-06-14 DIAGNOSIS — G8918 Other acute postprocedural pain: Secondary | ICD-10-CM | POA: Diagnosis not present

## 2021-06-14 DIAGNOSIS — E1122 Type 2 diabetes mellitus with diabetic chronic kidney disease: Secondary | ICD-10-CM | POA: Diagnosis not present

## 2021-06-14 DIAGNOSIS — S43432A Superior glenoid labrum lesion of left shoulder, initial encounter: Secondary | ICD-10-CM | POA: Diagnosis not present

## 2021-06-14 DIAGNOSIS — S46122A Laceration of muscle, fascia and tendon of long head of biceps, left arm, initial encounter: Secondary | ICD-10-CM | POA: Diagnosis not present

## 2021-06-14 DIAGNOSIS — S46212A Strain of muscle, fascia and tendon of other parts of biceps, left arm, initial encounter: Secondary | ICD-10-CM | POA: Diagnosis not present

## 2021-06-14 DIAGNOSIS — M7542 Impingement syndrome of left shoulder: Secondary | ICD-10-CM | POA: Diagnosis not present

## 2021-06-16 DIAGNOSIS — R519 Headache, unspecified: Secondary | ICD-10-CM | POA: Diagnosis not present

## 2021-06-16 DIAGNOSIS — I10 Essential (primary) hypertension: Secondary | ICD-10-CM | POA: Diagnosis not present

## 2021-07-03 DIAGNOSIS — N186 End stage renal disease: Secondary | ICD-10-CM | POA: Diagnosis not present

## 2021-07-03 DIAGNOSIS — Z992 Dependence on renal dialysis: Secondary | ICD-10-CM | POA: Diagnosis not present

## 2021-12-29 ENCOUNTER — Other Ambulatory Visit: Payer: Self-pay

## 2021-12-29 ENCOUNTER — Emergency Department (HOSPITAL_BASED_OUTPATIENT_CLINIC_OR_DEPARTMENT_OTHER): Payer: PPO

## 2021-12-29 ENCOUNTER — Encounter (HOSPITAL_BASED_OUTPATIENT_CLINIC_OR_DEPARTMENT_OTHER): Payer: Self-pay | Admitting: Emergency Medicine

## 2021-12-29 ENCOUNTER — Emergency Department (HOSPITAL_BASED_OUTPATIENT_CLINIC_OR_DEPARTMENT_OTHER)
Admission: EM | Admit: 2021-12-29 | Discharge: 2021-12-29 | Disposition: A | Discharge status: Home / Self Care | Payer: PPO | Attending: Emergency Medicine | Admitting: Emergency Medicine

## 2021-12-29 DIAGNOSIS — R1084 Generalized abdominal pain: Secondary | ICD-10-CM | POA: Insufficient documentation

## 2021-12-29 DIAGNOSIS — Z7901 Long term (current) use of anticoagulants: Secondary | ICD-10-CM | POA: Diagnosis not present

## 2021-12-29 DIAGNOSIS — Z7982 Long term (current) use of aspirin: Secondary | ICD-10-CM | POA: Insufficient documentation

## 2021-12-29 DIAGNOSIS — R1012 Left upper quadrant pain: Secondary | ICD-10-CM | POA: Diagnosis present

## 2021-12-29 DIAGNOSIS — Z79899 Other long term (current) drug therapy: Secondary | ICD-10-CM | POA: Insufficient documentation

## 2021-12-29 LAB — CBC WITH DIFFERENTIAL/PLATELET
Abs Immature Granulocytes: 0.02 10*3/uL (ref 0.00–0.07)
Basophils Absolute: 0 10*3/uL (ref 0.0–0.1)
Basophils Relative: 0 %
Eosinophils Absolute: 0.1 10*3/uL (ref 0.0–0.5)
Eosinophils Relative: 2 %
HCT: 35.7 % — ABNORMAL LOW (ref 36.0–46.0)
Hemoglobin: 11 g/dL — ABNORMAL LOW (ref 12.0–15.0)
Immature Granulocytes: 0 %
Lymphocytes Relative: 24 %
Lymphs Abs: 1.1 10*3/uL (ref 0.7–4.0)
MCH: 28.4 pg (ref 26.0–34.0)
MCHC: 30.8 g/dL (ref 30.0–36.0)
MCV: 92.2 fL (ref 80.0–100.0)
Monocytes Absolute: 0.5 10*3/uL (ref 0.1–1.0)
Monocytes Relative: 11 %
Neutro Abs: 2.8 10*3/uL (ref 1.7–7.7)
Neutrophils Relative %: 63 %
Platelets: 132 10*3/uL — ABNORMAL LOW (ref 150–400)
RBC: 3.87 MIL/uL (ref 3.87–5.11)
RDW: 15.3 % (ref 11.5–15.5)
WBC: 4.5 10*3/uL (ref 4.0–10.5)
nRBC: 0 % (ref 0.0–0.2)

## 2021-12-29 LAB — COMPREHENSIVE METABOLIC PANEL
ALT: 20 U/L (ref 0–44)
AST: 26 U/L (ref 15–41)
Albumin: 3.7 g/dL (ref 3.5–5.0)
Alkaline Phosphatase: 132 U/L — ABNORMAL HIGH (ref 38–126)
Anion gap: 7 (ref 5–15)
BUN: 47 mg/dL — ABNORMAL HIGH (ref 8–23)
CO2: 25 mmol/L (ref 22–32)
Calcium: 8.8 mg/dL — ABNORMAL LOW (ref 8.9–10.3)
Chloride: 106 mmol/L (ref 98–111)
Creatinine, Ser: 5.98 mg/dL — ABNORMAL HIGH (ref 0.44–1.00)
GFR, Estimated: 6 mL/min — ABNORMAL LOW (ref >60)
Glucose, Bld: 97 mg/dL (ref 70–99)
Potassium: 4.4 mmol/L (ref 3.5–5.1)
Sodium: 138 mmol/L (ref 135–145)
Total Bilirubin: 0.8 mg/dL (ref 0.3–1.2)
Total Protein: 6.8 g/dL (ref 6.5–8.1)

## 2021-12-29 LAB — URINALYSIS, ROUTINE W REFLEX MICROSCOPIC
Bilirubin Urine: NEGATIVE
Glucose, UA: NEGATIVE mg/dL
Ketones, ur: NEGATIVE mg/dL
Nitrite: NEGATIVE
Protein, ur: >=300 mg/dL — AB
Specific Gravity, Urine: 1.02 (ref 1.005–1.030)
pH: 8.5 — ABNORMAL HIGH (ref 5.0–8.0)

## 2021-12-29 LAB — URINALYSIS, MICROSCOPIC (REFLEX)

## 2021-12-29 LAB — LIPASE, BLOOD: Lipase: 57 U/L — ABNORMAL HIGH (ref 11–51)

## 2021-12-29 LAB — TROPONIN I (HIGH SENSITIVITY)
Troponin I (High Sensitivity): 28 ng/L — ABNORMAL HIGH (ref <18)
Troponin I (High Sensitivity): 28 ng/L — ABNORMAL HIGH (ref <18)

## 2021-12-29 MED ORDER — TRAMADOL HCL 50 MG PO TABS: 50.0000 mg | ORAL_TABLET | Freq: Four times a day (QID) | ORAL | 0 refills | Status: AC | PRN

## 2021-12-29 MED ORDER — IOHEXOL 300 MG/ML  SOLN
70.0000 mL | Freq: Once | INTRAMUSCULAR | Status: AC | PRN
  Administered 2021-12-29: 70 mL via INTRAVENOUS

## 2021-12-29 MED ORDER — MORPHINE SULFATE (PF) 2 MG/ML IV SOLN
2.0000 mg | Freq: Once | INTRAVENOUS | Status: AC
Start: 2021-12-29 — End: 2021-12-29
  Administered 2021-12-29: 2 mg via INTRAVENOUS
  Filled 2021-12-29: qty 1

## 2021-12-29 NOTE — ED Notes (Signed)
Pt transported to CT ?

## 2021-12-29 NOTE — ED Triage Notes (Signed)
Patient states that she has had pain on her left side for a couple months. States that she has also had pain in her lower stomach for a while. States that she is currently taking dialysis twice a week.Denies any constipation or diarrhea. Denies any dysuria. Denies any hematuria Patient states that she takes tylenol for relief

## 2021-12-29 NOTE — ED Provider Notes (Signed)
[facility] EMERGENCY DEPARTMENT Provider Note   CSN: [id number] Arrival date & time: 12/29/21  0900     History  Chief Complaint  Patient presents with   Abdominal Pain    Susan Frey is a 88 y.o. female.  The history is provided by the patient. No language interpreter was used.  Abdominal Pain Pain location:  LUQ Pain quality: aching   Pain radiates to:  Does not radiate Pain severity:  Moderate Onset quality:  Gradual Duration:  8 weeks Timing:  Constant Progression:  Worsening Chronicity:  Recurrent Context: not alcohol use, not medication withdrawal and not suspicious food intake   Relieved by:  Nothing Ineffective treatments:  None tried Associated symptoms: no anorexia   Risk factors: no alcohol abuse       Home Medications Prior to Admission medications   Medication Sig Start Date End Date Taking? Authorizing Provider  albuterol (VENTOLIN HFA) 108 (90 Base) MCG/ACT inhaler Inhale into the lungs. Patient not taking: Reported on 04/25/2020 08/02/16   [provider]  amLODipine (NORVASC) 5 MG tablet Take 5 mg by mouth daily. 02/02/20   [provider]  aspirin 81 MG EC tablet Take by mouth. 02/22/17   [provider]  calcium carbonate (TUMS EX) 750 MG chewable tablet Chew by mouth.    [provider]  diclofenac Sodium (VOLTAREN) 1 % GEL APPLY EXTERNALLY TO THE AFFECTED AREA THREE TIMES DAILY AS NEEDED 02/03/17   [provider]  omeprazole (PRILOSEC) 40 MG capsule Take by mouth. 03/15/20   [provider]  rosuvastatin (CRESTOR) 10 MG tablet Take 10 mg by mouth at bedtime. 03/31/20   [provider]  traMADol (ULTRAM) 50 MG tablet Take 50 mg by mouth 3 (three) times daily as needed. 04/16/20   [provider]  traMADol (ULTRAM-ER) 200 MG 24 hr tablet Take 200 mg by mouth daily. Patient states she takes as needed. Patient not taking: Reported on 04/25/2020    [provider]   warfarin (COUMADIN) 5 MG tablet Take 5 mg by mouth daily. Patient states she takes at night Patient not taking: Reported on 04/25/2020    [provider]      Allergies    Pregabalin, Atorvastatin, and Gabapentin    Review of Systems   Review of Systems  Gastrointestinal:  Positive for abdominal pain. Negative for anorexia.  All other systems reviewed and are negative.  Physical Exam Updated Vital Signs BP (!) 178/73   Pulse 76   Temp 98.1 F (36.7 C) (Oral)   Resp (!) 22   SpO2 100%  Physical Exam Vitals and nursing note reviewed.  Constitutional:      Appearance: She is well-developed.  HENT:     Head: Normocephalic.     Mouth/Throat:     Mouth: Mucous membranes are moist.  Cardiovascular:     Rate and Rhythm: Normal rate.  Pulmonary:     Effort: Pulmonary effort is normal.  Abdominal:     General: There is no distension.     Palpations: Abdomen is soft.     Tenderness: There is abdominal tenderness in the left upper quadrant.     Hernia: No hernia is present.  Musculoskeletal:        General: Normal range of motion.     Cervical back: Normal range of motion.  Skin:    General: Skin is warm.  Neurological:     General: No focal deficit present.  Mental Status: She is alert and oriented to person, place, and time.    ED Results / Procedures / Treatments   Labs (all labs ordered are listed, but only abnormal results are displayed) Labs Reviewed  CBC WITH DIFFERENTIAL/PLATELET - Abnormal; Notable for the following components:      Result Value   Hemoglobin 11.0 (*)    HCT 35.7 (*)    Platelets 132 (*)    All other components within normal limits  COMPREHENSIVE METABOLIC PANEL - Abnormal; Notable for the following components:   BUN 47 (*)    Creatinine, Ser 5.98 (*)    Calcium 8.8 (*)    Alkaline Phosphatase 132 (*)    GFR, Estimated 6 (*)    All other components within normal limits  LIPASE, BLOOD - Abnormal; Notable for the following  components:   Lipase 57 (*)    All other components within normal limits  URINALYSIS, ROUTINE W REFLEX MICROSCOPIC - Abnormal; Notable for the following components:   pH 8.5 (*)    Hgb urine dipstick SMALL (*)    Protein, ur >=300 (*)    Leukocytes,Ua TRACE (*)    All other components within normal limits  URINALYSIS, MICROSCOPIC (REFLEX) - Abnormal; Notable for the following components:   Bacteria, UA FEW (*)    All other components within normal limits  TROPONIN I (HIGH SENSITIVITY) - Abnormal; Notable for the following components:   Troponin I (High Sensitivity) 28 (*)    All other components within normal limits  TROPONIN I (HIGH SENSITIVITY) - Abnormal; Notable for the following components:   Troponin I (High Sensitivity) 28 (*)    All other components within normal limits    EKG EKG Interpretation  Date/Time:  Sunday Dec 29 2021 09:17:30 EDT Ventricular Rate:  78 PR Interval:  239 QRS Duration: 128 QT Interval:  468 QTC Calculation: 523 R Axis:   22 Text Interpretation: Sinus rhythm Atrial premature complex Prolonged PR interval Probable left atrial enlargement Left bundle branch block similar to prior tracing 5/22 Confirmed by Wynona Dove ([id number]) on 12/29/2021 9:21:46 AM  Radiology CT ABDOMEN PELVIS W CONTRAST  Result Date: 12/29/2021 CLINICAL DATA:  Acute, non localized abdominal pain. The patient reports having left and lower abdominal pain for 2 months. On dialysis twice a week. EXAM: CT ABDOMEN AND PELVIS WITH CONTRAST TECHNIQUE: Multidetector CT imaging of the abdomen and pelvis was performed using the standard protocol following bolus administration of intravenous contrast. RADIATION DOSE REDUCTION: This exam was performed according to the departmental dose-optimization program which includes automated exposure control, adjustment of the mA and/or kV according to patient size and/or use of iterative reconstruction technique. CONTRAST:  26mL OMNIPAQUE IOHEXOL 300 MG/ML   SOLN COMPARISON:  09/18/2020 FINDINGS: Lower chest: Small to moderate-sized right pleural effusion. Small left pleural effusion. Enlarged heart with marked right atrial enlargement included on the images. Small pericardial effusion with a maximum thickness of 8 mm. Mild right and minimal left basilar dependent atelectasis. There is also mild lingular atelectasis. Hepatobiliary: Several liver cysts. Dependent sludge or noncalcified gallstones in the gallbladder. No gallbladder wall thickening or pericholecystic fluid. Mild-to-moderate intrahepatic biliary ductal dilatation. Pancreas: Minimally dilated pancreatic duct. Otherwise, normal appearing pancreas. No obstructing mass or stones seen. Spleen: Normal in size without focal abnormality. Adrenals/Urinary Tract: Normal appearing adrenal glands. Small kidneys containing multiple small cysts and mildly patchy and mildly heterogeneous enhancement, within normal limits for a patient on dialysis. Unremarkable ureters and urinary bladder. Stomach/Bowel: Unremarkable stomach  and small bowel. Minimal sigmoid colon diverticulosis without evidence diverticulitis. Mildly prominent stool in the colon. The appendix is folded back upon itself and has a normal appearance. Vascular/Lymphatic: Atheromatous arterial calcifications without aneurysm, occlusions or evidence of mesenteric vascular compromise. No enlarged lymph nodes. Dilated inferior vena cava. Reproductive: Uterus and bilateral adnexa are unremarkable. Other: Minimal free peritoneal fluid in the posterior pelvis on the right. Musculoskeletal: Minimal lumbar and lower thoracic spine degenerative changes. IMPRESSION: 1. Small to moderate-sized right pleural effusion and small left pleural effusion. 2. Small pericardial effusion. 3. Cardiomegaly with marked right atrial enlargement included with dilatation of the inferior vena cava, compatible with an element of right heart failure. 4. Mild-to-moderate intrahepatic biliary  ductal dilatation. This may be normal for the age of the patient. A nonvisualized distal common duct stone or stricture could also produce this appearance. Similarly, there is minimal diffuse dilatation of the pancreatic duct. 5. Sludge or noncalcified gallstones in the dependent portion of the gallbladder. 6. Minimal free peritoneal fluid in the posterior pelvis on the right. Electronically Signed   By: Claudie Revering M.D.   On: 12/29/2021 12:05   DG Chest Port 1 View  Result Date: 12/29/2021 CLINICAL DATA:  Pain on left side for several months.  On dialysis. EXAM: PORTABLE CHEST 1 VIEW COMPARISON:  04/16/2020 FINDINGS: Cardiac enlargement. Small pleural effusions with blunting of the costophrenic angles. Mild interstitial edema with thickening of peripheral septal lines. No airspace consolidation identified. The visualized osseous structures are unremarkable. IMPRESSION: Trace pleural effusions and mild interstitial edema. Electronically Signed   By: Kerby Moors M.D.   On: 12/29/2021 09:55    Procedures Procedures    Medications Ordered in ED Medications  morphine (PF) 2 MG/ML injection 2 mg (2 mg Intravenous Given 12/29/21 1043)  iohexol (OMNIPAQUE) 300 MG/ML solution 70 mL (70 mLs Intravenous Contrast Given 12/29/21 1105)    ED Course/ Medical Decision Making/ A&P                           Medical Decision Making Pt reports she has pain in her lower abdomen that she has had for several months.  Pt reports pain in her left upper abdomen for 2 months.  Pt reports she is out of tramadol and can not reach her physicain to get refilled.    Amount and/or Complexity of Data Reviewed External Data Reviewed: notes.    Details: Internal medicine office notes reviewed.  Pt has seen gyn for evaluation of lower abdominal pain Labs: ordered. Decision-making details documented in ED Course.    Details: Labs ordered reviewed and interpreted patient has a stable hemoglobin of 11 BUN is 47 creatinine is  5.98 potassium is normal.  Patient has a slight elevation of her lipase at 57 UA shows few bacteria patient has a troponin that is flat at 28x2 Radiology: ordered and independent interpretation performed. Decision-making details documented in ED Course.    Details: Hest x-ray shows trace pleural effusions mild interstitial edema CT abdomen shows small right and left pleural effusions mild to moderate intrahepatic biliary duct dilation normal diffuse dilation of pancreatic duct gallbladder sludge. ECG/medicine tests: ordered and independent interpretation performed. Decision-making details documented in ED Course.  Risk Prescription drug management. Risk Details: Patient is given morphine 2 mg IV with some relief of her pain patient CT is reviewed and discussed with patient.  Does not have any pain on the right side of her abdomen.  Patient may have some chronic gallbladder disease but does not have any indications of acute cholecystitis.  patient is advised to follow-up with her Primary care physician.  Patient is given a prescription for tramadol which she is out of.  Patient's evaluation and testing reassuring she is advised she does need outpatient follow-up.  Patient advised to keep her appointment for dialysis tomorrow as scheduled           Final Clinical Impression(s) / ED Diagnoses Final diagnoses:  Generalized abdominal pain    Rx / DC Orders ED Discharge Orders     None      An After Visit Summary was printed and given to the patient.    Fransico Meadow, PA-C 12/29/21 [place], [place], DO 12/30/21 [phone]

## 2021-12-29 NOTE — Discharge Instructions (Addendum)
See your Physician for recheck.  Go to Dialysis tomorrow as scheduled

## 2022-03-07 ENCOUNTER — Encounter (HOSPITAL_BASED_OUTPATIENT_CLINIC_OR_DEPARTMENT_OTHER): Payer: Self-pay | Admitting: *Deleted

## 2022-03-07 ENCOUNTER — Emergency Department (HOSPITAL_BASED_OUTPATIENT_CLINIC_OR_DEPARTMENT_OTHER): Payer: PPO

## 2022-03-07 ENCOUNTER — Emergency Department (HOSPITAL_BASED_OUTPATIENT_CLINIC_OR_DEPARTMENT_OTHER)
Admission: EM | Admit: 2022-03-07 | Discharge: 2022-03-07 | Disposition: A | Discharge status: Home / Self Care | Payer: PPO | Attending: Emergency Medicine | Admitting: Emergency Medicine

## 2022-03-07 ENCOUNTER — Other Ambulatory Visit: Payer: Self-pay

## 2022-03-07 DIAGNOSIS — Z7982 Long term (current) use of aspirin: Secondary | ICD-10-CM | POA: Insufficient documentation

## 2022-03-07 DIAGNOSIS — R0789 Other chest pain: Secondary | ICD-10-CM

## 2022-03-07 DIAGNOSIS — R091 Pleurisy: Secondary | ICD-10-CM | POA: Insufficient documentation

## 2022-03-07 DIAGNOSIS — Z79899 Other long term (current) drug therapy: Secondary | ICD-10-CM | POA: Insufficient documentation

## 2022-03-07 DIAGNOSIS — Z7901 Long term (current) use of anticoagulants: Secondary | ICD-10-CM | POA: Insufficient documentation

## 2022-03-07 DIAGNOSIS — R69 Illness, unspecified: Secondary | ICD-10-CM

## 2022-03-07 DIAGNOSIS — R0602 Shortness of breath: Secondary | ICD-10-CM | POA: Insufficient documentation

## 2022-03-07 LAB — BASIC METABOLIC PANEL
Anion gap: 9 (ref 5–15)
BUN: 71 mg/dL — ABNORMAL HIGH (ref 8–23)
CO2: 23 mmol/L (ref 22–32)
Calcium: 8.9 mg/dL (ref 8.9–10.3)
Chloride: 107 mmol/L (ref 98–111)
Creatinine, Ser: 8.37 mg/dL — ABNORMAL HIGH (ref 0.44–1.00)
GFR, Estimated: 4 mL/min — ABNORMAL LOW (ref >60)
Glucose, Bld: 106 mg/dL — ABNORMAL HIGH (ref 70–99)
Potassium: 4.7 mmol/L (ref 3.5–5.1)
Sodium: 139 mmol/L (ref 135–145)

## 2022-03-07 LAB — CBC WITH DIFFERENTIAL/PLATELET
Abs Immature Granulocytes: 0.01 10*3/uL (ref 0.00–0.07)
Basophils Absolute: 0 10*3/uL (ref 0.0–0.1)
Basophils Relative: 1 %
Eosinophils Absolute: 0.1 10*3/uL (ref 0.0–0.5)
Eosinophils Relative: 3 %
HCT: 31.4 % — ABNORMAL LOW (ref 36.0–46.0)
Hemoglobin: 9.9 g/dL — ABNORMAL LOW (ref 12.0–15.0)
Immature Granulocytes: 0 %
Lymphocytes Relative: 30 %
Lymphs Abs: 1.1 10*3/uL (ref 0.7–4.0)
MCH: 29.3 pg (ref 26.0–34.0)
MCHC: 31.5 g/dL (ref 30.0–36.0)
MCV: 92.9 fL (ref 80.0–100.0)
Monocytes Absolute: 0.4 10*3/uL (ref 0.1–1.0)
Monocytes Relative: 12 %
Neutro Abs: 2 10*3/uL (ref 1.7–7.7)
Neutrophils Relative %: 54 %
Platelets: 133 10*3/uL — ABNORMAL LOW (ref 150–400)
RBC: 3.38 MIL/uL — ABNORMAL LOW (ref 3.87–5.11)
RDW: 15.6 % — ABNORMAL HIGH (ref 11.5–15.5)
WBC: 3.7 10*3/uL — ABNORMAL LOW (ref 4.0–10.5)
nRBC: 0 % (ref 0.0–0.2)

## 2022-03-07 LAB — TROPONIN I (HIGH SENSITIVITY): Troponin I (High Sensitivity): 30 ng/L — ABNORMAL HIGH (ref <18)

## 2022-03-07 LAB — BRAIN NATRIURETIC PEPTIDE: B Natriuretic Peptide: 3310.3 pg/mL — ABNORMAL HIGH (ref 0.0–100.0)

## 2022-03-07 MED ORDER — DEXAMETHASONE SODIUM PHOSPHATE 10 MG/ML IJ SOLN
10.0000 mg | Freq: Once | INTRAMUSCULAR | Status: AC
  Administered 2022-03-07: 10 mg via INTRAMUSCULAR
  Filled 2022-03-07: qty 1

## 2022-03-07 MED ORDER — HYDROCODONE-ACETAMINOPHEN 5-325 MG PO TABS
1.0000 | ORAL_TABLET | Freq: Once | ORAL | Status: AC
  Administered 2022-03-07: 1 via ORAL
  Filled 2022-03-07: qty 1

## 2022-03-07 NOTE — ED Triage Notes (Signed)
Has been having shortness of breath, states last night she had to get up to a chair to sleep, also having chest pain under left breast, pain and shortness of breath was worse last night. Monday and Friday has dialysis, Monday was last treatment

## 2022-03-07 NOTE — ED Notes (Signed)
Last trop no needed

## 2022-03-07 NOTE — ED Provider Notes (Signed)
[facility] EMERGENCY DEPARTMENT Provider Note   CSN: [id number] Arrival date & time: 03/07/22  3570     History  Chief Complaint  Patient presents with   Shortness of Breath    Susan Frey is a 88 y.o. female.  HPI   From note 7\24\23 [facility] emergency department by Dr. Brock Bad:  I had a long discussion about this patient with Dr. Neta Ehlers from nephrology and this patient has been having the same chest pain going on for about 2 months. Patient was recently admitted and not only did she have a cardiac thallium stress test and echocardiogram but she also had a CTA of the chest to rule out PE. It was felt the patient was having some musculoskeletal chest pain and Dr. Farley Ly just asked that we give her a shot of Toradol, and we can have her in addition to her tramadol at home she can take a little ibuprofen and follow-up with her primary care doctor. Patient was told that her cardiac enzymes did not elevate, and it was felt that her chest pain was musculoskeletal especially in lieu of the fact that she has had a recent negative stress test and CT angiography of the chest  Home Medications Prior to Admission medications   Medication Sig Start Date End Date Taking? Authorizing Provider  albuterol (VENTOLIN HFA) 108 (90 Base) MCG/ACT inhaler Inhale into the lungs. Patient not taking: Reported on 04/25/2020 08/02/16   [provider]  amLODipine (NORVASC) 5 MG tablet Take 5 mg by mouth daily. 02/02/20   [provider]  aspirin 81 MG EC tablet Take by mouth. 02/22/17   [provider]  calcium carbonate (TUMS EX) 750 MG chewable tablet Chew by mouth.    [provider]  diclofenac Sodium (VOLTAREN) 1 % GEL APPLY EXTERNALLY TO THE AFFECTED AREA THREE TIMES DAILY AS NEEDED 02/03/17   [provider]  omeprazole (PRILOSEC) 40 MG capsule Take by mouth. 03/15/20   [provider]  rosuvastatin (CRESTOR) 10 MG tablet  Take 10 mg by mouth at bedtime. 03/31/20   [provider]  traMADol (ULTRAM) 50 MG tablet Take 1 tablet (50 mg total) by mouth every 6 (six) hours as needed. 12/29/21 12/29/22  Fransico Meadow, PA-C  warfarin (COUMADIN) 5 MG tablet Take 5 mg by mouth daily. Patient states she takes at night Patient not taking: Reported on 04/25/2020    [provider]      Allergies    Pregabalin, Atorvastatin, and Gabapentin    Review of Systems   Review of Systems 10 systems reviewed negative except as per HPI Physical Exam Updated Vital Signs BP (!) 170/85   Pulse 68   Temp 97.9 F (36.6 C) (Oral)   Resp 16   Ht 5\' 3"  (1.6 m)   Wt 55.1 kg   SpO2 97%   BMI 21.52 kg/m  Physical Exam Constitutional:      Appearance: Normal appearance.  HENT:     Mouth/Throat:     Pharynx: Oropharynx is clear.  Eyes:     Extraocular Movements: Extraocular movements intact.  Cardiovascular:     Rate and Rhythm: Normal rate and regular rhythm.  Pulmonary:     Effort: Pulmonary effort is normal.     Breath sounds: Normal breath sounds.     Comments: Patient endorses focal undernourished to palpation over the left chest wall at approximately ribs 7 through 9.  There is no focal  skin changes or soft tissue changes.  No palpable abnormalities. Abdominal:     General: There is no distension.     Palpations: Abdomen is soft.     Tenderness: There is no abdominal tenderness. There is no guarding.  Musculoskeletal:        General: No swelling or tenderness. Normal range of motion.     Right lower leg: No edema.     Left lower leg: No edema.     Comments: No peripheral edema.  Calves are soft nontender.  Skin:    General: Skin is warm and dry.  Neurological:     General: No focal deficit present.     Mental Status: She is alert and oriented to person, place, and time.     Coordination: Coordination normal.  Psychiatric:        Mood and Affect: Mood normal.     ED Results / Procedures /  Treatments   Labs (all labs ordered are listed, but only abnormal results are displayed) Labs Reviewed  CBC WITH DIFFERENTIAL/PLATELET - Abnormal; Notable for the following components:      Result Value   WBC 3.7 (*)    RBC 3.38 (*)    Hemoglobin 9.9 (*)    HCT 31.4 (*)    RDW 15.6 (*)    Platelets 133 (*)    All other components within normal limits  BASIC METABOLIC PANEL - Abnormal; Notable for the following components:   Glucose, Bld 106 (*)    BUN 71 (*)    Creatinine, Ser 8.37 (*)    GFR, Estimated 4 (*)    All other components within normal limits  BRAIN NATRIURETIC PEPTIDE - Abnormal; Notable for the following components:   B Natriuretic Peptide 3,310.3 (*)    All other components within normal limits  TROPONIN I (HIGH SENSITIVITY) - Abnormal; Notable for the following components:   Troponin I (High Sensitivity) 30 (*)    All other components within normal limits  TROPONIN I (HIGH SENSITIVITY)    EKG EKG Interpretation  Date/Time:  Friday March 07 2022 08:45:28 EDT Ventricular Rate:  77 PR Interval:  222 QRS Duration: 131 QT Interval:  434 QTC Calculation: 492 R Axis:   76 Text Interpretation: Sinus rhythm Atrial premature complexes Prolonged PR interval Probable left atrial enlargement Left bundle branch block agree, old LBBB. inferior voltage change since previous tracing. Confirmed by Charlesetta Shanks [phone]) on 03/07/2022 9:34:24 AM  Radiology DG Chest Portable 1 View  Result Date: 03/07/2022 CLINICAL DATA:  Shortness of breath with chest and left breast pain area. EXAM: PORTABLE CHEST 1 VIEW COMPARISON:  Radiographs 02/24/2022 and 01/17/2022.  CT 01/10/2022. FINDINGS: 0913 hours. Stable cardiac enlargement. The pulmonary vascularity is normal, both there are mildly increasing opacities at both lung bases. Small bilateral pleural effusions have not significantly changed. No evidence pneumothorax. Skin fold overlies the right chest. There are surgical clips in the  lower right neck. The bones appear unremarkable. Telemetry leads overlie the chest. IMPRESSION: Interval mild increase in bibasilar pulmonary opacities which may reflect atelectasis related to the chronic pleural effusions. Early left lower lobe pneumonia difficult to completely exclude. Electronically Signed   By: Richardean Sale M.D.   On: 03/07/2022 09:37    Procedures Procedures    Medications Ordered in ED Medications  dexamethasone (DECADRON) injection 10 mg (has no administration in time range)  HYDROcodone-acetaminophen (NORCO/VICODIN) 5-325 MG per tablet 1 tablet (has no administration in time range)    ED  Course/ Medical Decision Making/ A&P                           Medical Decision Making Amount and/or Complexity of Data Reviewed Labs: ordered. Radiology: ordered. ECG/medicine tests: ordered.  Risk Prescription drug management.   Patient presents with left-sided chest wall pain.  She is alert nontoxic.  No respiratory distress.  Patient had multiple medical comorbidities.  However at this time she appears to be at baseline.  She is compliant with her dialysis.  She is nontoxic and well in appearance.  Had extensive diagnostic evaluation as reviewed by EMR.  At this time findings are very consistent with chest wall pain or pleurisy.  Basic labs and chest x-ray is interpreted by radiology reviewed by myself.  Patient is stable at baseline for chronic anemia, no leukocytosis, chest x-ray stable.  I have counseled the patient on her prior evaluation and the likely diagnosis of pleurisy or chest wall pain possibly due to nerve impingement.  Patient is given an IM dose of Decadron in the emergency department to help with inflammation.  She is counseled to continue with the tramadol as previously recommended for pain control . Patient is counseled to discuss with her doctor management.  If she persists in having severe intractable pain that is interfering with life quality, she can  discuss if she is a candidate for nerve block.  Patient is discharged in good condition.  She is alert and nontoxic.  Clinically well in appearance.        Final Clinical Impression(s) / ED Diagnoses Final diagnoses:  Pleurisy  Chest wall pain  Severe comorbid illness    Rx / DC Orders ED Discharge Orders     None         Charlesetta Shanks, MD 03/07/22 1052

## 2022-03-07 NOTE — Discharge Instructions (Signed)
1.  You had many tests done when you are at [facility] for your chest pain.  There is no sign of any blood clot in the lung, tumor or life-threatening problem.  Your pain is most likely due to pleurisy or a persistent nerve\muscle pain in your chest. 2.  You were given 1 dose of steroids in the emergency department to try to help with your pain.  You should continue to take tramadol as previously prescribed for pain control.  You may take 1 to 2 tablets every 6 hours as needed. 3.  If your pain persists and it is too severe, you may need to be seen by a pain specialist.  Sometimes people have severe pains in their side with her chest can get a nerve block to stop the pain.  Discussed this possibility with your doctor if your pain is persisting and interfering with your normal activities.

## 2022-03-07 NOTE — ED Notes (Signed)
ED Provider at bedside. 

## 2022-07-15 IMAGING — DX DG CHEST 1V PORT
1 series · 1 of 1 positions shown · non-contrast
Comparison: 04/16/2020

CLINICAL DATA: Pain on left side for several months.  On dialysis.

EXAM:
PORTABLE CHEST 1 VIEW

[chest ap]
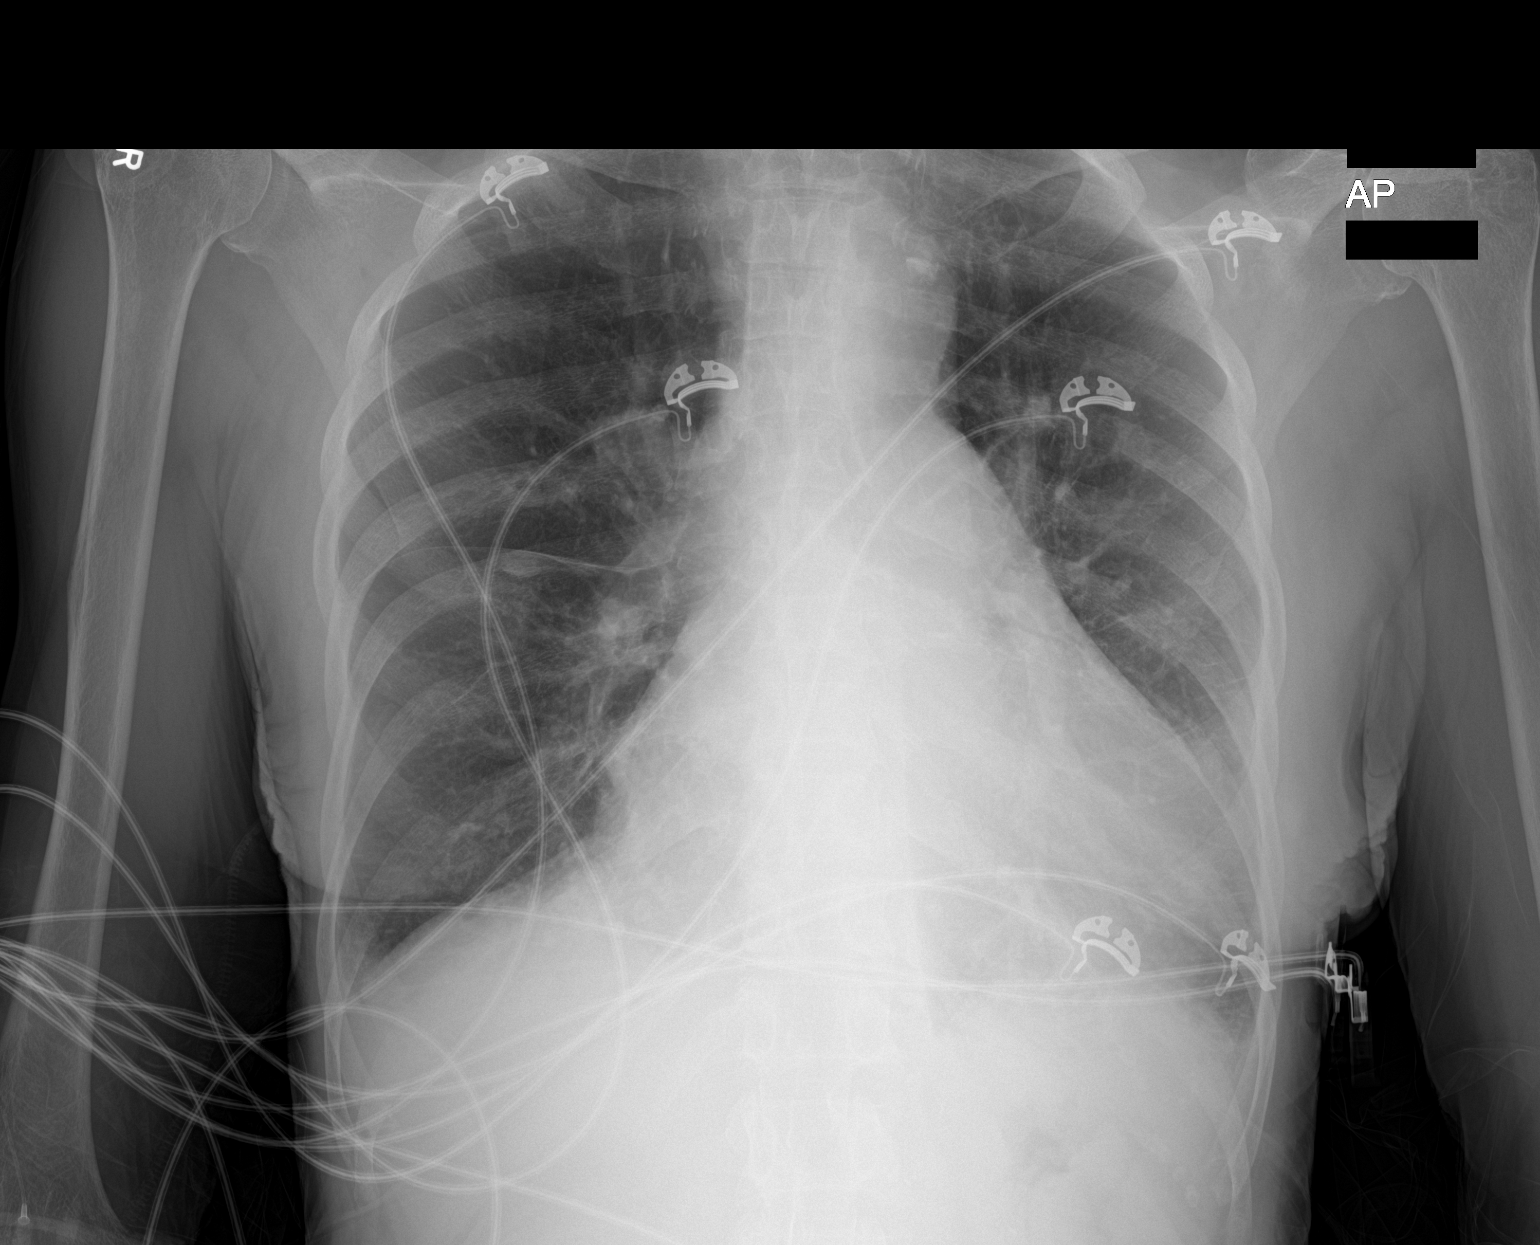

[1 of 1 positions shown; findings below may reference images not displayed]

FINDINGS: Cardiac enlargement. Small pleural effusions with blunting of the
costophrenic angles. Mild interstitial edema with thickening of
peripheral septal lines. No airspace consolidation identified. The
visualized osseous structures are unremarkable.
IMPRESSION: Trace pleural effusions and mild interstitial edema.

## 2022-07-15 IMAGING — CT CT ABD-PELV W/ CM
2 of 5 series · 15 of 46 positions shown, 17 images · IV contrast (agent unspecified)
Comparison: 09/18/2020

CLINICAL DATA: Acute, non localized abdominal pain. The patient
reports having left and lower abdominal pain for 2 months. On
dialysis twice a week.

EXAM:
CT ABDOMEN AND PELVIS WITH CONTRAST
TECHNIQUE: Multidetector CT imaging of the abdomen and pelvis was performed
using the standard protocol following bolus administration of
intravenous contrast.

[Series 2: axial st · axial · 0.74mm/px · z∈[+998,+1364]mm · 12 of 83 slices shown, 14 images]
[im 5/83  soft-tissue]
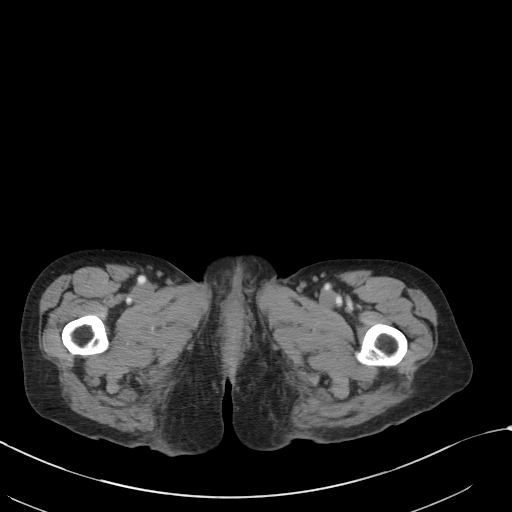
[im 5/83  bone]
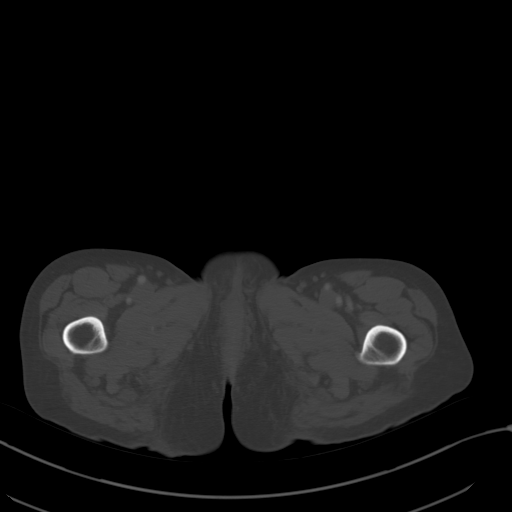
[im 13/83  soft-tissue]
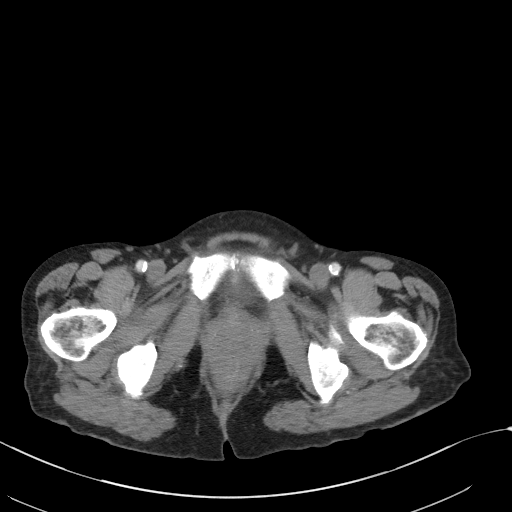
[im 17/83  soft-tissue]
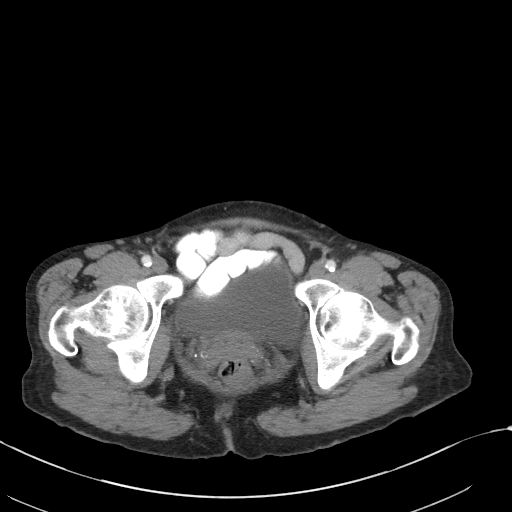
[im 25/83  soft-tissue]
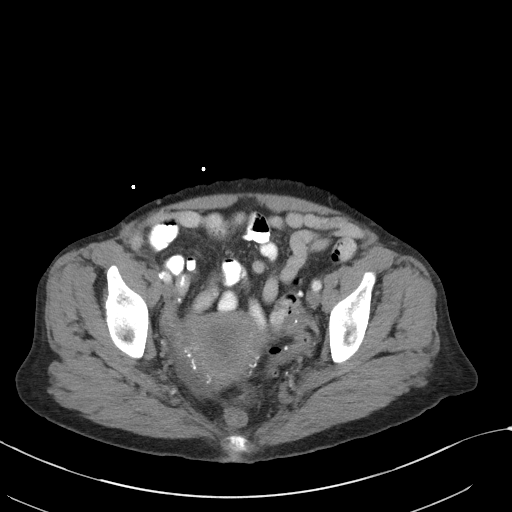
[im 33/83  soft-tissue]
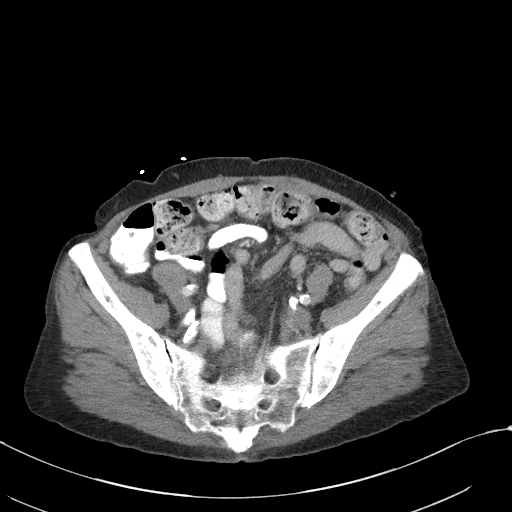
[im 37/83  soft-tissue]
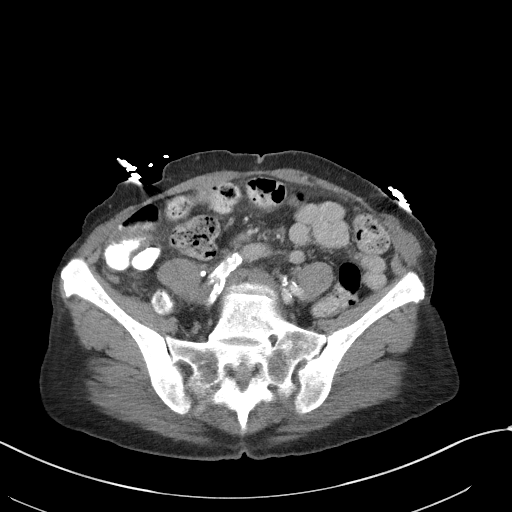
[im 46/83  soft-tissue]
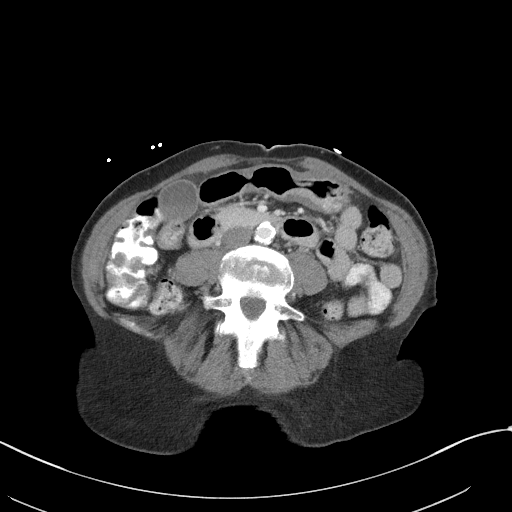
[im 50/83  soft-tissue]
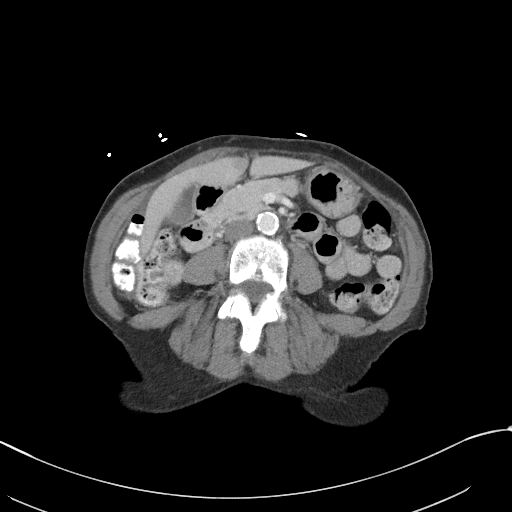
[im 58/83  soft-tissue]
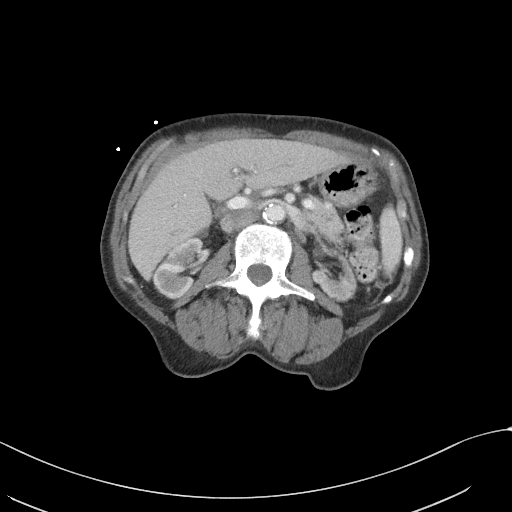
[im 58/83  bone]
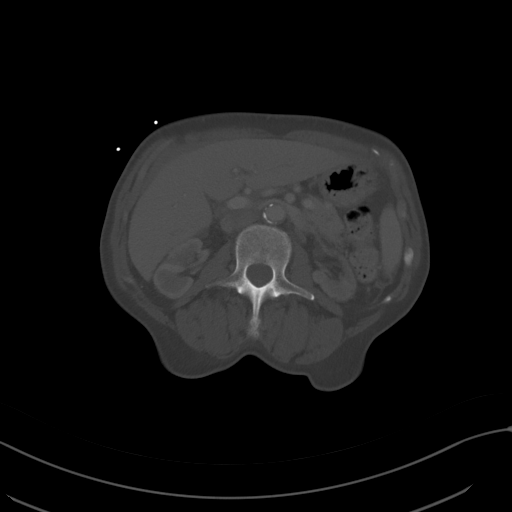
[im 66/83  soft-tissue]
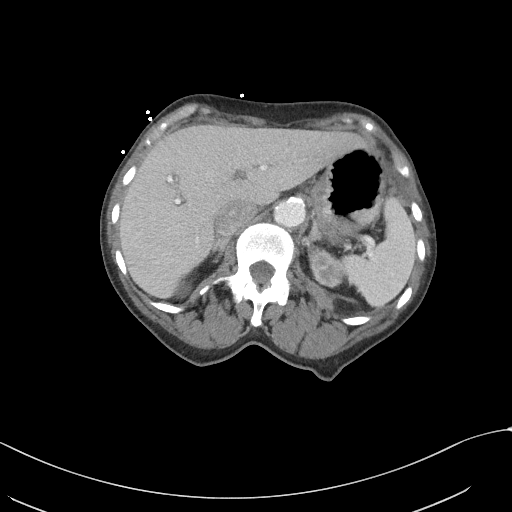
[im 70/83  soft-tissue]
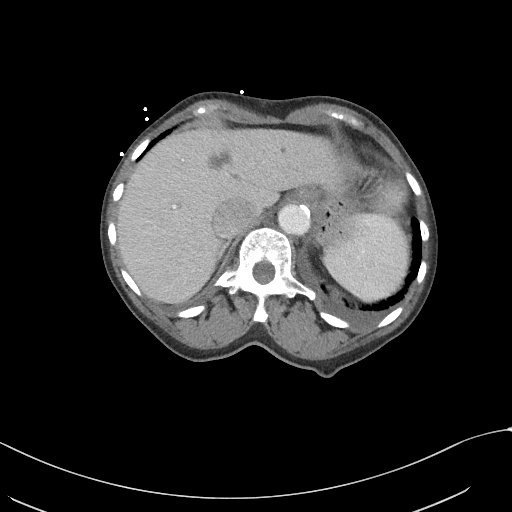
[im 78/83  soft-tissue]
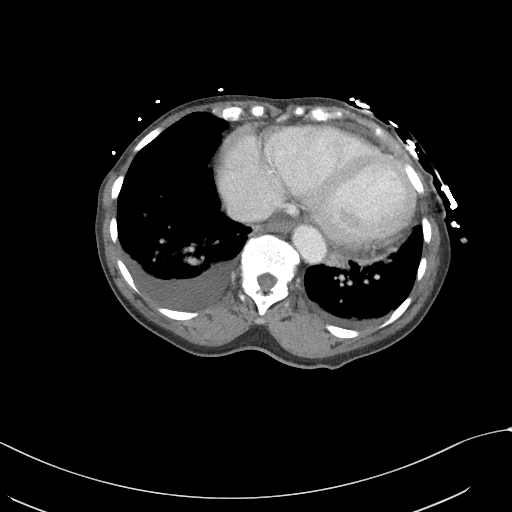

[Series 5: coronal st · coronal · 0.68mm/px · 3 of 91 slices shown]
[im 31/91  soft-tissue]
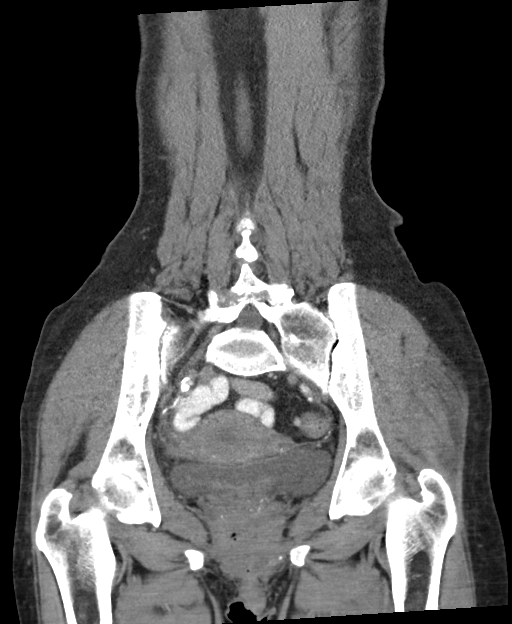
[im 41/91  soft-tissue]
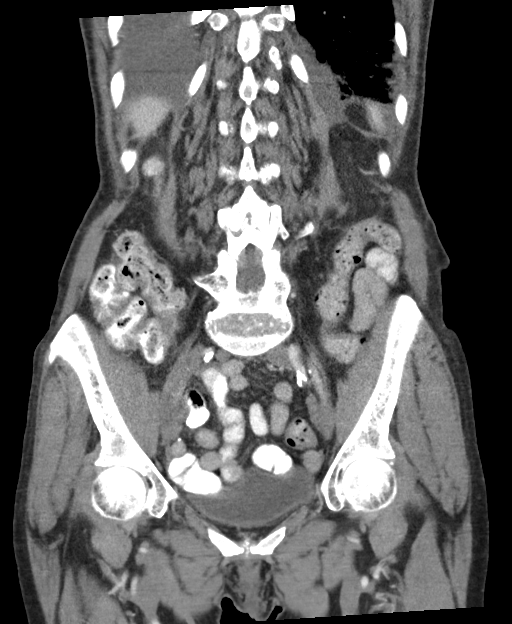
[im 51/91  soft-tissue]
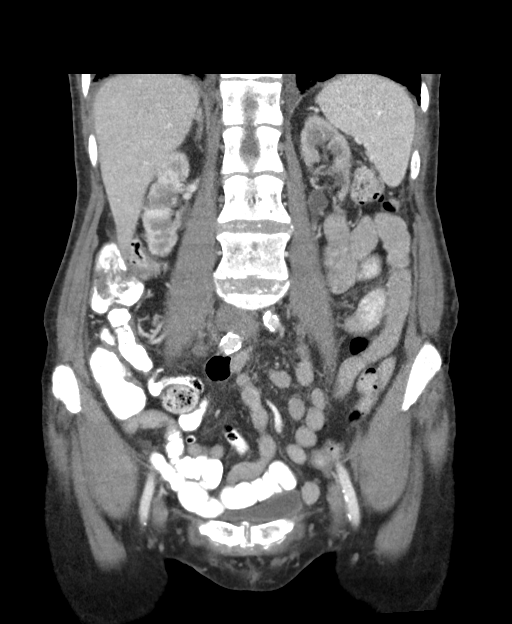

[15 of 46 positions shown; findings below may reference images not displayed]

RADIATION DOSE REDUCTION: This exam was performed according to the
departmental dose-optimization program which includes automated
exposure control, adjustment of the mA and/or kV according to
patient size and/or use of iterative reconstruction technique.

CONTRAST:  70mL OMNIPAQUE IOHEXOL 300 MG/ML  SOLN
FINDINGS: Lower chest: Small to moderate-sized right pleural effusion. Small
left pleural effusion. Enlarged heart with marked right atrial
enlargement included on the images. Small pericardial effusion with
a maximum thickness of 8 mm. Mild right and minimal left basilar
dependent atelectasis. There is also mild lingular atelectasis.

Hepatobiliary: Several liver cysts. Dependent sludge or noncalcified
gallstones in the gallbladder. No gallbladder wall thickening or
pericholecystic fluid. Mild-to-moderate intrahepatic biliary ductal
dilatation.

Pancreas: Minimally dilated pancreatic duct. Otherwise, normal
appearing pancreas. No obstructing mass or stones seen.

Spleen: Normal in size without focal abnormality.

Adrenals/Urinary Tract: Normal appearing adrenal glands. Small
kidneys containing multiple small cysts and mildly patchy and mildly
heterogeneous enhancement, within normal limits for a patient on
dialysis. Unremarkable ureters and urinary bladder.

Stomach/Bowel: Unremarkable stomach and small bowel. Minimal sigmoid
colon diverticulosis without evidence diverticulitis. Mildly
prominent stool in the colon. The appendix is folded back upon
itself and has a normal appearance.

Vascular/Lymphatic: Atheromatous arterial calcifications without
aneurysm, occlusions or evidence of mesenteric vascular compromise.
No enlarged lymph nodes. Dilated inferior vena cava.

Reproductive: Uterus and bilateral adnexa are unremarkable.

Other: Minimal free peritoneal fluid in the posterior pelvis on the
right.

Musculoskeletal: Minimal lumbar and lower thoracic spine
degenerative changes.
IMPRESSION: 1. Small to moderate-sized right pleural effusion and small left
pleural effusion.
2. Small pericardial effusion.
3. Cardiomegaly with marked right atrial enlargement included with
dilatation of the inferior vena cava, compatible with an element of
right heart failure.
4. Mild-to-moderate intrahepatic biliary ductal dilatation. This may
be normal for the age of the patient. A nonvisualized distal common
duct stone or stricture could also produce this appearance.
Similarly, there is minimal diffuse dilatation of the pancreatic
duct.
5. Sludge or noncalcified gallstones in the dependent portion of the
gallbladder.
6. Minimal free peritoneal fluid in the posterior pelvis on the
right.

## 2022-12-17 ENCOUNTER — Other Ambulatory Visit: Payer: Self-pay

## 2022-12-17 ENCOUNTER — Encounter (HOSPITAL_BASED_OUTPATIENT_CLINIC_OR_DEPARTMENT_OTHER): Payer: Self-pay | Admitting: Emergency Medicine

## 2022-12-17 ENCOUNTER — Emergency Department (HOSPITAL_BASED_OUTPATIENT_CLINIC_OR_DEPARTMENT_OTHER): Payer: PPO

## 2022-12-17 ENCOUNTER — Emergency Department (HOSPITAL_BASED_OUTPATIENT_CLINIC_OR_DEPARTMENT_OTHER)
Admission: EM | Admit: 2022-12-17 | Discharge: 2022-12-17 | Disposition: A | Discharge status: Home / Self Care | Payer: PPO | Attending: Emergency Medicine | Admitting: Emergency Medicine

## 2022-12-17 DIAGNOSIS — I120 Hypertensive chronic kidney disease with stage 5 chronic kidney disease or end stage renal disease: Secondary | ICD-10-CM | POA: Insufficient documentation

## 2022-12-17 DIAGNOSIS — Z7901 Long term (current) use of anticoagulants: Secondary | ICD-10-CM | POA: Diagnosis not present

## 2022-12-17 DIAGNOSIS — N186 End stage renal disease: Secondary | ICD-10-CM | POA: Insufficient documentation

## 2022-12-17 DIAGNOSIS — Z7982 Long term (current) use of aspirin: Secondary | ICD-10-CM | POA: Diagnosis not present

## 2022-12-17 DIAGNOSIS — Z79899 Other long term (current) drug therapy: Secondary | ICD-10-CM | POA: Diagnosis not present

## 2022-12-17 DIAGNOSIS — Z992 Dependence on renal dialysis: Secondary | ICD-10-CM | POA: Diagnosis not present

## 2022-12-17 DIAGNOSIS — R42 Dizziness and giddiness: Secondary | ICD-10-CM | POA: Diagnosis present

## 2022-12-17 LAB — CBC WITH DIFFERENTIAL/PLATELET
Abs Immature Granulocytes: 0.01 10*3/uL (ref 0.00–0.07)
Basophils Absolute: 0 10*3/uL (ref 0.0–0.1)
Basophils Relative: 1 %
Eosinophils Absolute: 0.1 10*3/uL (ref 0.0–0.5)
Eosinophils Relative: 3 %
HCT: 39.5 % (ref 36.0–46.0)
Hemoglobin: 12.1 g/dL (ref 12.0–15.0)
Immature Granulocytes: 0 %
Lymphocytes Relative: 30 %
Lymphs Abs: 1.3 10*3/uL (ref 0.7–4.0)
MCH: 29 pg (ref 26.0–34.0)
MCHC: 30.6 g/dL (ref 30.0–36.0)
MCV: 94.7 fL (ref 80.0–100.0)
Monocytes Absolute: 0.5 10*3/uL (ref 0.1–1.0)
Monocytes Relative: 13 %
Neutro Abs: 2.3 10*3/uL (ref 1.7–7.7)
Neutrophils Relative %: 53 %
Platelets: 152 10*3/uL (ref 150–400)
RBC: 4.17 MIL/uL (ref 3.87–5.11)
RDW: 15.7 % — ABNORMAL HIGH (ref 11.5–15.5)
WBC: 4.2 10*3/uL (ref 4.0–10.5)
nRBC: 0 % (ref 0.0–0.2)

## 2022-12-17 LAB — BASIC METABOLIC PANEL
Anion gap: 16 — ABNORMAL HIGH (ref 5–15)
BUN: 39 mg/dL — ABNORMAL HIGH (ref 8–23)
CO2: 27 mmol/L (ref 22–32)
Calcium: 9.3 mg/dL (ref 8.9–10.3)
Chloride: 92 mmol/L — ABNORMAL LOW (ref 98–111)
Creatinine, Ser: 7.33 mg/dL — ABNORMAL HIGH (ref 0.44–1.00)
GFR, Estimated: 5 mL/min — ABNORMAL LOW (ref >60)
Glucose, Bld: 162 mg/dL — ABNORMAL HIGH (ref 70–99)
Potassium: 3.9 mmol/L (ref 3.5–5.1)
Sodium: 135 mmol/L (ref 135–145)

## 2022-12-17 NOTE — ED Triage Notes (Signed)
Dialysis patient , had it yesterday , felt dizzy since then , headache . Alert and oriented x 4 , wheeled to triage .

## 2022-12-17 NOTE — ED Provider Notes (Addendum)
[facility] Provider Note   CSN: [id number] Arrival date & time: 12/17/22  1233     History  Chief Complaint  Patient presents with   Dizziness    Susan Frey is a 89 y.o. female.  Patient here with intermittent dizziness since dialysis session yesterday.  She goes to dialysis Monday and Friday.  She had an extra session yesterday and towards the end of the session she started to get lightheaded.  When she stands up she will get lightheaded and then it resolves.  Maybe some headaches.  She is not having any dizziness or lightheadedness at rest.  She denies any black or bloody stools.  She denies any fever or chills.  No cough or sputum production.  No abdominal pain.  Nothing makes it worse or better.  She takes amlodipine for blood pressure.  No new medications.  She is not on Coumadin anymore.  The history is provided by the patient.       Home Medications Prior to Admission medications   Medication Sig Start Date End Date Taking? Authorizing Provider  albuterol (VENTOLIN HFA) 108 (90 Base) MCG/ACT inhaler Inhale into the lungs. Patient not taking: Reported on 04/25/2020 08/02/16   [provider]  amLODipine (NORVASC) 5 MG tablet Take 5 mg by mouth daily. 02/02/20   [provider]  aspirin 81 MG EC tablet Take by mouth. 02/22/17   [provider]  calcium carbonate (TUMS EX) 750 MG chewable tablet Chew by mouth.    [provider]  diclofenac Sodium (VOLTAREN) 1 % GEL APPLY EXTERNALLY TO THE AFFECTED AREA THREE TIMES DAILY AS NEEDED 02/03/17   [provider]  omeprazole (PRILOSEC) 40 MG capsule Take by mouth. 03/15/20   [provider]  rosuvastatin (CRESTOR) 10 MG tablet Take 10 mg by mouth at bedtime. 03/31/20   [provider]  traMADol (ULTRAM) 50 MG tablet Take 1 tablet (50 mg total) by mouth every 6 (six) hours as needed. 12/29/21 12/29/22  Elson Areas, PA-C  warfarin  (COUMADIN) 5 MG tablet Take 5 mg by mouth daily. Patient states she takes at night Patient not taking: Reported on 04/25/2020    [provider]      Allergies    Pregabalin, Atorvastatin, and Gabapentin    Review of Systems   Review of Systems  Physical Exam Updated Vital Signs BP 90/64 (BP Location: Right Arm)   Pulse 78   Temp 98.4 F (36.9 C)   Resp 20   Wt 51.3 kg   SpO2 92%   BMI 20.02 kg/m  Physical Exam Vitals and nursing note reviewed.  Constitutional:      General: She is not in acute distress.    Appearance: She is well-developed. She is not ill-appearing.  HENT:     Head: Normocephalic and atraumatic.  Eyes:     Extraocular Movements: Extraocular movements intact.     Conjunctiva/sclera: Conjunctivae normal.     Pupils: Pupils are equal, round, and reactive to light.  Cardiovascular:     Rate and Rhythm: Normal rate and regular rhythm.     Pulses: Normal pulses.     Heart sounds: Normal heart sounds. No murmur heard. Pulmonary:     Effort: Pulmonary effort is normal. No respiratory distress.     Breath sounds: Normal breath sounds.  Abdominal:     Palpations: Abdomen is soft.     Tenderness: There is no abdominal tenderness.  Musculoskeletal:        General: No swelling.     Cervical back: Normal range of motion and neck supple.  Skin:    General: Skin is warm and dry.     Capillary Refill: Capillary refill takes less than 2 seconds.  Neurological:     General: No focal deficit present.     Mental Status: She is alert and oriented to person, place, and time.     Cranial Nerves: No cranial nerve deficit.     Sensory: No sensory deficit.     Motor: No weakness.     Coordination: Coordination normal.     Comments: 5+ out of 5 strength throughout, normal sensation, no drift, normal finger-nose-finger, normal speech, normal gait  Psychiatric:        Mood and Affect: Mood normal.     ED Results / Procedures / Treatments   Labs (all labs  ordered are listed, but only abnormal results are displayed) Labs Reviewed  CBC WITH DIFFERENTIAL/PLATELET - Abnormal; Notable for the following components:      Result Value   RDW 15.7 (*)    All other components within normal limits  BASIC METABOLIC PANEL - Abnormal; Notable for the following components:   Chloride 92 (*)    Glucose, Bld 162 (*)    BUN 39 (*)    Creatinine, Ser 7.33 (*)    GFR, Estimated 5 (*)    Anion gap 16 (*)    All other components within normal limits    EKG EKG Interpretation  Date/Time:  Wednesday Dec 17 2022 12:49:48 EDT Ventricular Rate:  86 PR Interval:  226 QRS Duration: 145 QT Interval:  448 QTC Calculation: 536 R Axis:   56 Text Interpretation: Sinus rhythm Prolonged PR interval Confirmed by Virgina Norfolk ([id number]) on 12/17/2022 12:57:55 PM  Radiology DG Chest Portable 1 View  Result Date: 12/17/2022 CLINICAL DATA:  Weakness EXAM: PORTABLE CHEST 1 VIEW COMPARISON:  X-ray 11/28/2022 FINDINGS: Enlarged cardiopericardial silhouette. No consolidation, pneumothorax, effusion or edema. Hyperinflation. Overlapping cardiac leads. Surgical clips along the low right neck. IMPRESSION: Hyperinflation.  Enlarged heart. Electronically Signed   By: Karen Kays M.D.   On: 12/17/2022 13:33   CT Head Wo Contrast  Result Date: 12/17/2022 CLINICAL DATA:  Headache and dizziness. EXAM: CT HEAD WITHOUT CONTRAST TECHNIQUE: Contiguous axial images were obtained from the base of the skull through the vertex without intravenous contrast. RADIATION DOSE REDUCTION: This exam was performed according to the departmental dose-optimization program which includes automated exposure control, adjustment of the mA and/or kV according to patient size and/or use of iterative reconstruction technique. COMPARISON:  None Available. FINDINGS: Brain: No evidence of acute infarction, hemorrhage, hydrocephalus, extra-axial collection or mass lesion/mass effect. Remote lacunar infarcts identified  within bilateral basal ganglia. Vascular: No hyperdense vessel or unexpected calcification. Skull: Normal. Negative for fracture or focal lesion. Sinuses/Orbits: Paranasal sinuses and mastoid air cells are clear. Other: None IMPRESSION: 1. No acute intracranial abnormalities. 2. Remote bilateral basal ganglia lacunar infarcts. Electronically Signed   By: Signa Kell M.D.   On: 12/17/2022 13:32    Procedures Procedures    Medications Ordered in ED Medications - No data to display  ED Course/ Medical Decision Making/ A&P                             Medical Decision Making Amount and/or Complexity of Data Reviewed Labs: ordered. Radiology: ordered.  Christeen Douglas is here with episodes of dizziness and lightheadedness.  Normal vitals.  No fever.  However her orthostatics are mildly positive.  Blood pressure is 107/70 lying still and dropped to 87/67 when she stood.  She came a little bit symptomatic but she was able to ambulate without any issues.  Sounds like she has been having lightheaded episodes when she stands up quickly here over the last 24 hours after having an extra session of dialysis yesterday.  She normally has dialysis Monday and Friday.  But she had an extra session yesterday because it sounds like maybe she was above her dry weight.  She is not having any shortness of breath or chest pain.  Maybe a mild headache.  She has a history of high blood pressure and end-stage renal disease.  She maybe was on Coumadin in the past but no longer taking Coumadin.  She has a normal neurological exam.  She is well-appearing.  No fever.  No concern for infectious process.  Denies any black or bloody stools.  Will check CBC and BMP and head CT.  EKG shows sinus rhythm.  No ischemic changes.  Differential diagnosis likely orthostatic process, seems less likely to be anemia or electrolyte abnormality or neurologic related.  Will get labs and CT and reevaluate.  At this time her blood pressure recovers  pretty quickly.  She is very mildly symptomatic.  Little hesitant to give her back IV fluids at this time.  Will check labs and make decision otherwise after that.  Per my review and interpretation labs no significant anemia or electrolyte abnormality.  Creatinine at baseline.  Head CT is unremarkable.  Chest x-ray with no evidence of volume overload or pneumonia.  Overall she is asymptomatic at rest.  Blood pressure has stabilized.  She might be mildly orthostatic but she is very functional in the room without a lot of issues.  Recommend that she just take her time when she changes positions.  Follow-up with primary care doctor.  Discharged in good condition.  I will have patient hold amlodipine dose tomorrow.  Overall I do suspect patient is mildly over dialyzed.  I suspect holding amlodipine tomorrow given her body sometime this will normalize on its own.  This chart was dictated using voice recognition software.  Despite best efforts to proofread,  errors can occur which can change the documentation meaning.         Final Clinical Impression(s) / ED Diagnoses Final diagnoses:  Lightheadedness    Rx / DC Orders ED Discharge Orders     None         Virgina Norfolk, DO 12/17/22 1351    Virgina Norfolk, DO 12/17/22 1353

## 2022-12-17 NOTE — Discharge Instructions (Signed)
I recommend holding your amlodipine blood pressure medicine tomorrow.  Then resume the next day.

## 2023-06-21 ENCOUNTER — Other Ambulatory Visit: Payer: Self-pay

## 2023-06-21 ENCOUNTER — Emergency Department (HOSPITAL_COMMUNITY)
Admission: EM | Admit: 2023-06-21 | Discharge: 2023-06-21 | Disposition: A | Discharge status: Home / Self Care | Payer: PPO | Attending: Emergency Medicine | Admitting: Emergency Medicine

## 2023-06-21 ENCOUNTER — Encounter (HOSPITAL_COMMUNITY): Payer: Self-pay

## 2023-06-21 ENCOUNTER — Emergency Department (HOSPITAL_COMMUNITY): Payer: PPO

## 2023-06-21 DIAGNOSIS — R079 Chest pain, unspecified: Secondary | ICD-10-CM | POA: Diagnosis present

## 2023-06-21 DIAGNOSIS — Z7982 Long term (current) use of aspirin: Secondary | ICD-10-CM | POA: Diagnosis not present

## 2023-06-21 DIAGNOSIS — R0789 Other chest pain: Secondary | ICD-10-CM | POA: Insufficient documentation

## 2023-06-21 DIAGNOSIS — Z7901 Long term (current) use of anticoagulants: Secondary | ICD-10-CM | POA: Diagnosis not present

## 2023-06-21 DIAGNOSIS — N186 End stage renal disease: Secondary | ICD-10-CM | POA: Insufficient documentation

## 2023-06-21 LAB — URINALYSIS, ROUTINE W REFLEX MICROSCOPIC
Bacteria, UA: NONE SEEN
Bilirubin Urine: NEGATIVE
Glucose, UA: NEGATIVE mg/dL
Ketones, ur: NEGATIVE mg/dL
Nitrite: NEGATIVE
Protein, ur: 100 mg/dL — AB
Specific Gravity, Urine: 1.014 (ref 1.005–1.030)
pH: 6 (ref 5.0–8.0)

## 2023-06-21 LAB — CBC
HCT: 36.3 % (ref 36.0–46.0)
Hemoglobin: 10.9 g/dL — ABNORMAL LOW (ref 12.0–15.0)
MCH: 28.8 pg (ref 26.0–34.0)
MCHC: 30 g/dL (ref 30.0–36.0)
MCV: 95.8 fL (ref 80.0–100.0)
Platelets: 143 10*3/uL — ABNORMAL LOW (ref 150–400)
RBC: 3.79 MIL/uL — ABNORMAL LOW (ref 3.87–5.11)
RDW: 18 % — ABNORMAL HIGH (ref 11.5–15.5)
WBC: 4.3 10*3/uL (ref 4.0–10.5)
nRBC: 0 % (ref 0.0–0.2)

## 2023-06-21 LAB — COMPREHENSIVE METABOLIC PANEL WITH GFR
ALT: 20 U/L (ref 0–44)
AST: 27 U/L (ref 15–41)
Albumin: 3.5 g/dL (ref 3.5–5.0)
Alkaline Phosphatase: 100 U/L (ref 38–126)
Anion gap: 16 — ABNORMAL HIGH (ref 5–15)
BUN: 85 mg/dL — ABNORMAL HIGH (ref 8–23)
CO2: 21 mmol/L — ABNORMAL LOW (ref 22–32)
Calcium: 8.7 mg/dL — ABNORMAL LOW (ref 8.9–10.3)
Chloride: 103 mmol/L (ref 98–111)
Creatinine, Ser: 10.5 mg/dL — ABNORMAL HIGH (ref 0.44–1.00)
GFR, Estimated: 3 mL/min — ABNORMAL LOW
Glucose, Bld: 95 mg/dL (ref 70–99)
Potassium: 5.1 mmol/L (ref 3.5–5.1)
Sodium: 140 mmol/L (ref 135–145)
Total Bilirubin: 0.9 mg/dL
Total Protein: 7.2 g/dL (ref 6.5–8.1)

## 2023-06-21 LAB — LIPASE, BLOOD: Lipase: 72 U/L — ABNORMAL HIGH (ref 11–51)

## 2023-06-21 LAB — TROPONIN I (HIGH SENSITIVITY): Troponin I (High Sensitivity): 40 ng/L — ABNORMAL HIGH

## 2023-06-21 MED ORDER — DULOXETINE HCL 20 MG PO CPEP
20.0000 mg | ORAL_CAPSULE | Freq: Once | ORAL | Status: AC
  Administered 2023-06-21: 20 mg via ORAL
  Filled 2023-06-21: qty 1

## 2023-06-21 MED ORDER — DULOXETINE HCL 20 MG PO CPEP: 20.0000 mg | ORAL_CAPSULE | Freq: Every day | ORAL | 0 refills | Status: AC

## 2023-06-21 NOTE — Discharge Instructions (Signed)
As discussed, you are starting a new medication for relief of your left lower chest pain.  It is very point to discuss this with your physician.  Please call tomorrow for the next available appointment.

## 2023-06-21 NOTE — ED Provider Notes (Signed)
[place] EMERGENCY DEPARTMENT AT [facility] Provider Note   CSN: [id number] Arrival date & time: 06/21/23  1010     History  Chief Complaint  Patient presents with   Abdominal Pain    Susan Frey is a 89 y.o. female.  HPI Patient presents with pain in the left hemithorax, anterior, roughly T9.  Pains been present for a long time, worse overnight.  She is been seen and evaluated here and at other locations for this pain.  No new dyspnea, no other chest pain, no other abdominal pain, no nausea, vomiting, chills.    Home Medications Prior to Admission medications   Medication Sig Start Date End Date Taking? Authorizing Provider  DULoxetine (CYMBALTA) 20 MG capsule Take 1 capsule (20 mg total) by mouth daily. 06/21/23  Yes Gerhard Munch, MD  albuterol (VENTOLIN HFA) 108 (90 Base) MCG/ACT inhaler Inhale into the lungs. Patient not taking: Reported on 04/25/2020 08/02/16   [provider]  amLODipine (NORVASC) 5 MG tablet Take 5 mg by mouth daily. 02/02/20   [provider]  aspirin 81 MG EC tablet Take by mouth. 02/22/17   [provider]  calcium carbonate (TUMS EX) 750 MG chewable tablet Chew by mouth.    [provider]  diclofenac Sodium (VOLTAREN) 1 % GEL APPLY EXTERNALLY TO THE AFFECTED AREA THREE TIMES DAILY AS NEEDED 02/03/17   [provider]  omeprazole (PRILOSEC) 40 MG capsule Take by mouth. 03/15/20   [provider]  rosuvastatin (CRESTOR) 10 MG tablet Take 10 mg by mouth at bedtime. 03/31/20   [provider]  warfarin (COUMADIN) 5 MG tablet Take 5 mg by mouth daily. Patient states she takes at night Patient not taking: Reported on 04/25/2020    [provider]      Allergies    Pregabalin, Atorvastatin, and Gabapentin    Review of Systems   Review of Systems  Physical Exam Updated Vital Signs BP (!) 164/71   Pulse 60   Temp 98.3 F (36.8 C)   Resp 18   Ht 5\' 3"  (1.6 m)   Wt  51.3 kg   SpO2 100%   BMI 20.02 kg/m  Physical Exam Vitals and nursing note reviewed.  Constitutional:      General: She is not in acute distress.    Appearance: She is well-developed.  HENT:     Head: Normocephalic and atraumatic.  Eyes:     Conjunctiva/sclera: Conjunctivae normal.  Cardiovascular:     Rate and Rhythm: Normal rate and regular rhythm.  Pulmonary:     Effort: Pulmonary effort is normal. No respiratory distress.     Breath sounds: Normal breath sounds. No stridor.  Chest:    Abdominal:     General: There is no distension.  Skin:    General: Skin is warm and dry.  Neurological:     Mental Status: She is alert and oriented to person, place, and time.     Cranial Nerves: No cranial nerve deficit.  Psychiatric:        Mood and Affect: Mood normal.     ED Results / Procedures / Treatments   Labs (all labs ordered are listed, but only abnormal results are displayed) Labs Reviewed  LIPASE, BLOOD - Abnormal; Notable for the following components:      Result Value   Lipase 72 (*)    All other components within normal limits  COMPREHENSIVE METABOLIC PANEL - Abnormal; Notable for the following components:  CO2 21 (*)    BUN 85 (*)    Creatinine, Ser 10.50 (*)    Calcium 8.7 (*)    GFR, Estimated 3 (*)    Anion gap 16 (*)    All other components within normal limits  CBC - Abnormal; Notable for the following components:   RBC 3.79 (*)    Hemoglobin 10.9 (*)    RDW 18.0 (*)    Platelets 143 (*)    All other components within normal limits  URINALYSIS, ROUTINE W REFLEX MICROSCOPIC - Abnormal; Notable for the following components:   Hgb urine dipstick SMALL (*)    Protein, ur 100 (*)    Leukocytes,Ua SMALL (*)    All other components within normal limits  TROPONIN I (HIGH SENSITIVITY) - Abnormal; Notable for the following components:   Troponin I (High Sensitivity) 40 (*)    All other components within normal limits  TROPONIN I (HIGH SENSITIVITY)     EKG EKG Interpretation Date/Time:  Sunday June 21 2023 10:20:09 EST Ventricular Rate:  58 PR Interval:  248 QRS Duration:  138 QT Interval:  524 QTC Calculation: 514 R Axis:   -73  Text Interpretation: Sinus bradycardia with 1st degree A-V block with Premature atrial complexes Left axis deviation Non-specific intra-ventricular conduction block Minimal voltage criteria for LVH, may be normal variant ( Cornell product ) Inferior infarct , age undetermined Cannot rule out Anterior infarct , age undetermined T wave abnormality, consider lateral ischemia Abnormal ECG When compared with ECG of 17-Dec-2022 12:49, PREVIOUS ECG IS PRESENT Confirmed by Gerhard Munch [phone]) on 06/21/2023 12:22:07 PM  Radiology DG Chest 2 View  Result Date: 06/21/2023 CLINICAL DATA:  Left upper quadrant abdominal pain. EXAM: CHEST - 2 VIEW COMPARISON:  One-view chest x-ray 12/17/2022. FINDINGS: Heart is enlarged. No edema or effusion is present. No focal airspace disease is present. No pneumothorax is present. The visualized soft tissues and bony thorax are unremarkable. IMPRESSION: Cardiomegaly without failure. Electronically Signed   By: Marin Roberts M.D.   On: 06/21/2023 11:31    Procedures Procedures    Medications Ordered in ED Medications  DULoxetine (CYMBALTA) DR capsule 20 mg (has no administration in time range)    ED Course/ Medical Decision Making/ A&P                                 Medical Decision Making Elderly female presents with ongoing episodic pain in the left hemithorax.  Distribution suggests radiculopathy, though with her history of end-stage renal disease, other medical problems, considerations include PE, pneumonia, pathologic fracture, intra-abdominal processes. Cardiac 60 sinus normal Pulse ox 100% room air normal I reviewed the patient's chart including documentation as far back as 2 years ago which had a presentation for essentially same pain.  She and has had  multiple CT scans in the interval, without alarming findings.  Amount and/or Complexity of Data Reviewed External Data Reviewed: notes. Labs: ordered. Decision-making details documented in ED Course. Radiology: ordered and independent interpretation performed. Decision-making details documented in ED Course. ECG/medicine tests: ordered and independent interpretation performed. Decision-making details documented in ED Course.  Risk Prescription drug management. Decision regarding hospitalization. Diagnosis or treatment significantly limited by social determinants of health.   12:29 PM On repeat exam patient in no distress.  Labs reviewed, x-ray reviewed, labs consistent with prior.  She does have borderline elevated troponin, though consistent with her prior studies and this  is not uncommon for end-stage renal disease patients.  Patient's x-ray reviewed, unremarkable ECG without evidence for new ischemia, given the duration of pain suspicion for no ACS, no pneumonia, no peritonitis.  Patient amenable to initiation of medication for possible radiculopathy from the thoracic spine, will follow-up with primary care.        Final Clinical Impression(s) / ED Diagnoses Final diagnoses:  Atypical chest pain    Rx / DC Orders ED Discharge Orders          Ordered    DULoxetine (CYMBALTA) 20 MG capsule  Daily        06/21/23 1228              Gerhard Munch, MD 06/21/23 1229

## 2023-06-21 NOTE — ED Triage Notes (Signed)
Reports LUQ pain and under her breast.  Reports its been going on for quite a while.  Reports vomiting this morning.   Denies chest pain sob.

## 2023-09-08 ENCOUNTER — Other Ambulatory Visit: Payer: Self-pay

## 2023-09-08 ENCOUNTER — Emergency Department (HOSPITAL_BASED_OUTPATIENT_CLINIC_OR_DEPARTMENT_OTHER): Payer: PPO

## 2023-09-08 ENCOUNTER — Emergency Department (HOSPITAL_BASED_OUTPATIENT_CLINIC_OR_DEPARTMENT_OTHER)
Admission: EM | Admit: 2023-09-08 | Discharge: 2023-09-08 | Disposition: A | Discharge status: Home / Self Care | Payer: PPO | Attending: Emergency Medicine | Admitting: Emergency Medicine

## 2023-09-08 ENCOUNTER — Encounter (HOSPITAL_BASED_OUTPATIENT_CLINIC_OR_DEPARTMENT_OTHER): Payer: Self-pay | Admitting: Emergency Medicine

## 2023-09-08 DIAGNOSIS — E1122 Type 2 diabetes mellitus with diabetic chronic kidney disease: Secondary | ICD-10-CM | POA: Diagnosis not present

## 2023-09-08 DIAGNOSIS — Z7982 Long term (current) use of aspirin: Secondary | ICD-10-CM | POA: Diagnosis not present

## 2023-09-08 DIAGNOSIS — I120 Hypertensive chronic kidney disease with stage 5 chronic kidney disease or end stage renal disease: Secondary | ICD-10-CM | POA: Insufficient documentation

## 2023-09-08 DIAGNOSIS — Z79899 Other long term (current) drug therapy: Secondary | ICD-10-CM | POA: Diagnosis not present

## 2023-09-08 DIAGNOSIS — R519 Headache, unspecified: Secondary | ICD-10-CM | POA: Diagnosis not present

## 2023-09-08 DIAGNOSIS — Z992 Dependence on renal dialysis: Secondary | ICD-10-CM | POA: Diagnosis not present

## 2023-09-08 DIAGNOSIS — N186 End stage renal disease: Secondary | ICD-10-CM | POA: Insufficient documentation

## 2023-09-08 DIAGNOSIS — J45909 Unspecified asthma, uncomplicated: Secondary | ICD-10-CM | POA: Insufficient documentation

## 2023-09-08 DIAGNOSIS — R221 Localized swelling, mass and lump, neck: Secondary | ICD-10-CM | POA: Diagnosis present

## 2023-09-08 HISTORY — DX: Disorder of kidney and ureter, unspecified: N28.9

## 2023-09-08 LAB — TROPONIN I (HIGH SENSITIVITY)
Troponin I (High Sensitivity): 83 ng/L — ABNORMAL HIGH (ref <18)
Troponin I (High Sensitivity): 76 ng/L — ABNORMAL HIGH (ref <18)

## 2023-09-08 LAB — BASIC METABOLIC PANEL
Anion gap: 15 (ref 5–15)
BUN: 57 mg/dL — ABNORMAL HIGH (ref 8–23)
CO2: 25 mmol/L (ref 22–32)
Calcium: 8.6 mg/dL — ABNORMAL LOW (ref 8.9–10.3)
Chloride: 102 mmol/L (ref 98–111)
Creatinine, Ser: 9.61 mg/dL — ABNORMAL HIGH (ref 0.44–1.00)
GFR, Estimated: 4 mL/min — ABNORMAL LOW (ref >60)
Glucose, Bld: 131 mg/dL — ABNORMAL HIGH (ref 70–99)
Potassium: 4.2 mmol/L (ref 3.5–5.1)
Sodium: 142 mmol/L (ref 135–145)

## 2023-09-08 LAB — CBC
HCT: 36 % (ref 36.0–46.0)
Hemoglobin: 11.2 g/dL — ABNORMAL LOW (ref 12.0–15.0)
MCH: 29.1 pg (ref 26.0–34.0)
MCHC: 31.1 g/dL (ref 30.0–36.0)
MCV: 93.5 fL (ref 80.0–100.0)
Platelets: 96 10*3/uL — ABNORMAL LOW (ref 150–400)
RBC: 3.85 MIL/uL — ABNORMAL LOW (ref 3.87–5.11)
RDW: 18.6 % — ABNORMAL HIGH (ref 11.5–15.5)
WBC: 3.8 10*3/uL — ABNORMAL LOW (ref 4.0–10.5)
nRBC: 0 % (ref 0.0–0.2)

## 2023-09-08 MED ORDER — IOHEXOL 350 MG/ML SOLN
100.0000 mL | Freq: Once | INTRAVENOUS | Status: AC | PRN
  Administered 2023-09-08: 75 mL via INTRAVENOUS

## 2023-09-08 MED ORDER — IOHEXOL 350 MG/ML SOLN: 100.0000 mL | Freq: Once | INTRAVENOUS | Status: DC | PRN

## 2023-09-08 NOTE — ED Notes (Signed)
 Pt woke up and took a shower this morning. Noticed the R side of her neck is hurting and tender to the touch. Pulsating vessels noted to R lateral neck region, consistent with HR. Pt has what looks like varicose spiral veins over the surface. Warm to touch, no obvious bruising, no recent trauma. Pt is also complaining of R trapezius pain and R epigastric tenderness with palpation.

## 2023-09-08 NOTE — ED Notes (Signed)
 Discharge instructions reviewed with patient. Patient verbalizes understanding, no further questions at this time. Medications/prescriptions and follow up information provided. No acute distress noted at time of departure.

## 2023-09-08 NOTE — ED Provider Notes (Signed)
 [place] EMERGENCY DEPARTMENT AT [facility] Provider Note   CSN: [id number] Arrival date & time: 09/08/23  1121     History  Chief Complaint  Patient presents with   Torticollis    Susan Frey is a 89 y.o. female PMH diabetes, ESRD on HD MWF (Left arm fistula), HLD, HTN, asthma, hx PE.  HPI Presents with new onset right-sided neck swelling and pain. The swelling was first noticed the previous day, and has been associated with a headache, described as a daily pain across the forehead. Has been having headache for a while (chronic). The patient also reports occasional epistaxis when blowing their nose yesterday but none since. The neck pain is described as not severe, but was bothersome enough to disrupt sleep. The patient denies any missed dialysis sessions.     Home Medications Prior to Admission medications   Medication Sig Start Date End Date Taking? Authorizing Provider  albuterol (VENTOLIN HFA) 108 (90 Base) MCG/ACT inhaler Inhale into the lungs. Patient not taking: Reported on 04/25/2020 08/02/16   [provider]  amLODipine (NORVASC) 5 MG tablet Take 5 mg by mouth daily. 02/02/20   [provider]  aspirin 81 MG EC tablet Take by mouth. 02/22/17   [provider]  calcium carbonate (TUMS EX) 750 MG chewable tablet Chew by mouth.    [provider]  diclofenac Sodium (VOLTAREN) 1 % GEL APPLY EXTERNALLY TO THE AFFECTED AREA THREE TIMES DAILY AS NEEDED 02/03/17   [provider]  DULoxetine  (CYMBALTA ) 20 MG capsule Take 1 capsule (20 mg total) by mouth daily. 06/21/23   Garrick Charleston, MD  omeprazole (PRILOSEC) 40 MG capsule Take by mouth. 03/15/20   [provider]  rosuvastatin (CRESTOR) 10 MG tablet Take 10 mg by mouth at bedtime. 03/31/20   [provider]  warfarin (COUMADIN) 5 MG tablet Take 5 mg by mouth daily. Patient states she takes at night Patient not taking: Reported on 04/25/2020    [provider]      Allergies    Pregabalin, Atorvastatin, and Gabapentin    Review of Systems   Review of Systems  Constitutional:  Negative for chills and fever.  HENT:  Negative for ear pain, sore throat and trouble swallowing.   Eyes:  Negative for pain and visual disturbance.  Respiratory:  Negative for cough and shortness of breath.   Cardiovascular:  Negative for chest pain and palpitations.  Gastrointestinal:  Negative for abdominal pain and vomiting.  Musculoskeletal:  Positive for neck pain. Negative for arthralgias and back pain.  Skin:  Negative for color change and rash.  Neurological:  Negative for syncope.  All other systems reviewed and are negative.   Physical Exam Updated Vital Signs BP 135/84 (BP Location: Right Arm)   Pulse 92   Temp 98 F (36.7 C) (Oral)   Resp 16   Ht 5' 3 (1.6 m)   Wt 51.2 kg   SpO2 93%   BMI 20.00 kg/m  Physical Exam Vitals and nursing note reviewed.  Constitutional:      General: She is not in acute distress.    Appearance: She is well-developed.  HENT:     Head: Normocephalic and atraumatic.  Eyes:     Conjunctiva/sclera: Conjunctivae normal.  Neck:     Vascular: No carotid bruit.     Comments: Pulsatile vascular mass above right clavicle Cardiovascular:     Rate and Rhythm: Normal rate and regular rhythm.  Pulses: Normal pulses.     Heart sounds: No murmur heard. Pulmonary:     Effort: Pulmonary effort is normal. No respiratory distress.     Breath sounds: Normal breath sounds.  Abdominal:     Palpations: Abdomen is soft.     Tenderness: There is no abdominal tenderness.  Musculoskeletal:        General: No swelling.     Cervical back: Neck supple. Tenderness present.  Skin:    General: Skin is warm and dry.     Capillary Refill: Capillary refill takes less than 2 seconds.  Neurological:     Mental Status: She is alert.  Psychiatric:        Mood and Affect: Mood normal.     ED Results / Procedures /  Treatments   Labs (all labs ordered are listed, but only abnormal results are displayed) Labs Reviewed  BASIC METABOLIC PANEL - Abnormal; Notable for the following components:      Result Value   Glucose, Bld 131 (*)    BUN 57 (*)    Creatinine, Ser 9.61 (*)    Calcium 8.6 (*)    GFR, Estimated 4 (*)    All other components within normal limits  CBC - Abnormal; Notable for the following components:   WBC 3.8 (*)    RBC 3.85 (*)    Hemoglobin 11.2 (*)    RDW 18.6 (*)    Platelets 96 (*)    All other components within normal limits  TROPONIN I (HIGH SENSITIVITY) - Abnormal; Notable for the following components:   Troponin I (High Sensitivity) 83 (*)    All other components within normal limits  TROPONIN I (HIGH SENSITIVITY)    EKG None  Radiology DG Chest Port 1 View Result Date: 09/08/2023 CLINICAL DATA:  Neck pain.  Headache. EXAM: PORTABLE CHEST 1 VIEW COMPARISON:  06/21/2023. FINDINGS: Bilateral lung fields are clear. Bilateral costophrenic angles are clear. Stable moderately enlarged cardio-mediastinal silhouette. No acute osseous abnormalities. The soft tissues are within normal limits. IMPRESSION: No active disease. Electronically Signed   By: Ree Molt M.D.   On: 09/08/2023 13:09    Procedures Procedures   Medications Ordered in ED Medications  iohexol  (OMNIPAQUE ) 350 MG/ML injection 100 mL (75 mLs Intravenous Contrast Given 09/08/23 1403)    ED Course/ Medical Decision Making/ A&P Clinical Course as of 09/08/23 1512  Tue Sep 08, 2023  1507 Stable  89 YOF ESRD here with ccx of right neck mass.  Pending CTA:HN for etiology. Fistula is right sided. ESRD MWF Initial troponin elevated. Pending delta. No CP. [] FU 2nd Trop [CC]    Clinical Course User Index [CC] Jerral Meth, MD                               Medical Decision Making Amount and/or Complexity of Data Reviewed Labs: ordered. Radiology: ordered.   Medical Decision Making:   Susan Frey  is a 89 y.o. female who presented to the ED today with pulsatile right neck mass detailed above.    Patient's presentation is complicated by their history of ESRD on HD.  Complete initial physical exam performed, notably the patient  was complaining of right neck pain and did have pulsations significant over right clavicular region.    Reviewed and confirmed nursing documentation for past medical history, family history, social history.    Initial Assessment:   With the patient's presentation of  right venous pulsation, most likely diagnosis is possible vascular anomaly/malformation. Other diagnoses were considered including (but not limited to) AV fistula, pseudoaneurysm, paraganglioma, SVC syndrome, UE clot.   Initial Plan:  CT Head/Neck  Screening labs including CBC and Metabolic panel to evaluate for infectious or metabolic etiology of disease.  CXR to evaluate for structural/infectious intrathoracic pathology.  EKG, troponin ordered in triage to evaluate for cardiac pathology Objective evaluation as below reviewed   Initial Study Results:   Laboratory  All laboratory results reviewed without evidence of clinically relevant pathology.   Exceptions include: ESRD, K wnl, troponin initial 83, awaiting repeat  EKG EKG was reviewed independently. Rate, rhythm, axis, intervals all examined and without medically relevant abnormality. Some T wave inversions in V6  but otherwise similar to previous EKGs. No ST elevations.   Radiology:  All images reviewed independently. Agree with radiology report at this time.   DG Chest Port 1 View Result Date: 09/08/2023 CLINICAL DATA:  Neck pain.  Headache. EXAM: PORTABLE CHEST 1 VIEW COMPARISON:  06/21/2023. FINDINGS: Bilateral lung fields are clear. Bilateral costophrenic angles are clear. Stable moderately enlarged cardio-mediastinal silhouette. No acute osseous abnormalities. The soft tissues are within normal limits. IMPRESSION: No active disease.  Electronically Signed   By: Ree Molt M.D.   On: 09/08/2023 13:09    Final Assessment and Plan:   89 year old female here for right sided pulsatile mass + headaches. CTA Head/Neck obtained and awaiting results. Possible vascular anomaly but must rule out more serious causes with CTA. Troponin initially obtained in triage at 83, repeat troponin pending. New T wave inversion in V6 but patient not symptomatic/no chest pain. Most likely elevated in the setting of ESRD but will trend as initial obtained was elevated. Patient signed out to oncoming EDP Dr. Jerral. Dispo awaiting CT results and additional troponin.    Clinical Impression:  1. Pulsatile neck mass      Data Unavailable      Final diagnoses:  Pulsatile neck mass    Rx / DC Orders ED Discharge Orders     None         Christia Budds, MD 09/08/23 1512    Ellouise Fine K, DO 09/08/23 1515

## 2023-09-08 NOTE — ED Provider Triage Note (Signed)
 Emergency Medicine Provider Triage Evaluation Note  Ronal LITTIE Edison , a 89 y.o. female  was evaluated in triage.  Pt complains of right-sided neck pain and headache.  States she was showering yesterday when she noticed this.  Also noticed a pulsatile mass at that time.  States she has not noticed that previously.  No chest pain.  She is a dialysis patient  Review of Systems  Positive: As above Negative: As above  Physical Exam  Resp 16   Ht 5' 3 (1.6 m)   Wt 51.2 kg   BMI 20.00 kg/m  Gen:   Awake, no distress   Resp:  Normal effort  MSK:   Moves extremities without difficulty  Other:  Jugular venous pulsations in the right supraclavicular region  Medical Decision Making  Medically screening exam initiated at 12:07 PM.  Appropriate orders placed.  Ronal LITTIE Edison was informed that the remainder of the evaluation will be completed by another provider, this initial triage assessment does not replace that evaluation, and the importance of remaining in the ED until their evaluation is complete.  Workup initiated   Edwardo Marsa HERO, DEVONNA 09/08/23 1208

## 2023-09-08 NOTE — ED Triage Notes (Signed)
Dialysis pain with rt neck pain and bulging blood vessel on rt side of neck having headaches  since yesterday has had a runny nose

## 2023-09-08 NOTE — Discharge Instructions (Addendum)
 You have notable vascular pathology in your neck.  You need to call your [facility] vascular surgery physician to schedule an outpatient follow-up. Dr. Pearline [facility] health vascular physician) stated he would be able to work you into clinic if you are unable to see your normal vascular doctor in a timely fashion. Please follow up with your PCP and continue going to dialysis as scheduled  We also need to follow-up with your cardiologist for ongoing care and management in the outpatient setting.  If you have any chest pain, shortness of breath or lightheadedness you need to return as we discussed.

## 2023-09-08 NOTE — ED Provider Notes (Signed)
 Care of patient received from prior provider at 4:36 PM, please see their note for complete H/P and care plan.  Received handoff per ED course.  Clinical Course as of 09/08/23 1636  Tue Sep 08, 2023  1507 Stable  20 YOF ESRD here with ccx of right neck mass.  Pending CTA:HN for etiology. Fistula is right sided. ESRD MWF Initial troponin elevated. Pending delta. No CP. [] FU 2nd Trop [CC]    Clinical Course User Index [CC] Jerral Meth, MD    Reassessment: On reassessment, still has mild neck pain. Substantial vascular disease on CTA. Patient follows with V[facility] Surgery at [facility]. Consulted our vascular surgery for review of the current presentation and for reccomendations on management  Reassessment: Vascular surgery here stated that patient could be safely followed up outpatient (Dr. Pearline).  He stated that they would help to coordinate follow-up with their clinic the patient would also be welcome to follow-up with her primary vascular surgeon.  Patient denies fevers chills nausea vomiting syncope shortness of breath.  No acute distress this time.  Considered possibility of cardiac disease causing her presentation today.  Troponin is downtrending, neck pain is very atypical does not radiate to the jaw nor shoulder. Shared medical decision making regarding keeping patient for cardiology evaluation and admission versus outpatient management with patient and she feels comfortable calling her cardiologist following up in the outpatient setting.  Called family to give them the exact same conversation and they are in agreement with outpatient care plan.    Jerral Meth, MD 09/08/23 [phone]

## 2023-09-09 ENCOUNTER — Telehealth: Payer: Self-pay | Admitting: Vascular Surgery

## 2023-10-16 NOTE — Telephone Encounter (Signed)
 Unable to schedule pt appt.

## 2024-05-05 ENCOUNTER — Ambulatory Visit: Payer: Self-pay | Admitting: Internal Medicine

## 2024-09-19 ENCOUNTER — Ambulatory Visit: Payer: Self-pay | Admitting: Internal Medicine
